# Patient Record
Sex: Male | Born: 1980 | Race: White | Hispanic: No | Marital: Married | Smoking: Former smoker
Health system: Southern US, Community
[De-identification: ages and names within clinical notes are randomized; demographics above are authoritative.]

## PROBLEM LIST (undated history)

## (undated) DIAGNOSIS — F32A Depression, unspecified: Secondary | ICD-10-CM

## (undated) DIAGNOSIS — F329 Major depressive disorder, single episode, unspecified: Secondary | ICD-10-CM

## (undated) DIAGNOSIS — K219 Gastro-esophageal reflux disease without esophagitis: Secondary | ICD-10-CM

## (undated) DIAGNOSIS — F419 Anxiety disorder, unspecified: Secondary | ICD-10-CM

## (undated) HISTORY — PX: APPENDECTOMY: SHX54

---

## 2016-09-29 DIAGNOSIS — K219 Gastro-esophageal reflux disease without esophagitis: Secondary | ICD-10-CM | POA: Insufficient documentation

## 2018-01-30 DIAGNOSIS — F329 Major depressive disorder, single episode, unspecified: Secondary | ICD-10-CM | POA: Insufficient documentation

## 2018-04-06 ENCOUNTER — Encounter: Payer: Self-pay | Admitting: Family Medicine

## 2018-04-06 ENCOUNTER — Ambulatory Visit: Payer: Commercial Managed Care - PPO | Admitting: Family Medicine

## 2018-04-06 VITALS — BP 139/88 | HR 93 | Ht 71.0 in | Wt 225.0 lb

## 2018-04-06 DIAGNOSIS — M25551 Pain in right hip: Secondary | ICD-10-CM | POA: Diagnosis not present

## 2018-04-06 DIAGNOSIS — M25552 Pain in left hip: Secondary | ICD-10-CM | POA: Diagnosis not present

## 2018-04-06 MED ORDER — DICLOFENAC SODIUM 75 MG PO TBEC: 75.0000 mg | DELAYED_RELEASE_TABLET | Freq: Two times a day (BID) | ORAL | 1 refills | Status: DC

## 2018-04-06 MED ORDER — PREDNISONE 10 MG PO TABS: ORAL_TABLET | ORAL | 0 refills | Status: DC

## 2018-04-06 NOTE — Patient Instructions (Addendum)
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 Avoid painful activities as much as possible. Ice the area 3-4 times a day for 15 minutes at a time. Take prednisone dose pack x 6 days with food for pain and inflammation. Day AFTER finishing the prednisone, restart the diclofenac twice a day with food. Physical therapy for hip, pelvic, core stretching/strengthening has shown to be helpful for this condition once you're out of the very painful phase. Avoid activities that increase the pain if possible. Follow up with me in 4 weeks.

## 2018-04-07 ENCOUNTER — Encounter: Payer: Self-pay | Admitting: Family Medicine

## 2018-04-07 NOTE — Progress Notes (Signed)
PCP: Houston Siren., MD Consultation requested by: Dollene Primrose PA-C  Subjective:   HPI: Patient is a 37 y.o. male here for bilateral hip pain.  Patient reports about 4-5 weeks ago he started to get pain in medial aspect of left groin without injury. Radiation down thigh medially as well but not past the knee. Then one day felt same pain on right side with same radiation. Pain 3/10 but up to 8-10/10 and sharp at times. Some benefit with ibuprofen. Tried diclofenac and muscle relaxant as well. Had been walking 2 miles a day at lunchtime but not done since this started. Pain worse with abduction of hip muscles. No prior injury - no other increase in activity level aside from his walking at lunch. No skin changes, numbness. No bowel/bladder dysfunction. No dysuria, GI complaints. Intermittent soreness of low back but not concurrent with his groin pain. Radiographs of bilateral hips were normal, lumbar spine only with mild degenerative facet changes at L5-S1 UA was negative but concentrated, CBC normal, BMP without abnormalities to account for pain, ESR elevated mildly at 25, CK normal at 59.  History reviewed. No pertinent past medical history.  Current Outpatient Medications on File Prior to Visit  Medication Sig Dispense Refill  . Testosterone 2 MG/24HR PT24 Place onto the skin.    Marland Kitchen omeprazole (PRILOSEC) 20 MG capsule Take by mouth daily.  5  . pravastatin (PRAVACHOL) 20 MG tablet Take 20 mg by mouth daily.  11  . venlafaxine XR (EFFEXOR-XR) 37.5 MG 24 hr capsule Take by mouth daily.  11   No current facility-administered medications on file prior to visit.     History reviewed. No pertinent surgical history.  No Known Allergies  Social History   Socioeconomic History  . Marital status: Married    Spouse name: Not on file  . Number of children: Not on file  . Years of education: Not on file  . Highest education level: Not on file  Occupational History  .  Not on file  Social Needs  . Financial resource strain: Not on file  . Food insecurity:    Worry: Not on file    Inability: Not on file  . Transportation needs:    Medical: Not on file    Non-medical: Not on file  Tobacco Use  . Smoking status: Never Smoker  . Smokeless tobacco: Never Used  Substance and Sexual Activity  . Alcohol use: Not on file  . Drug use: Not on file  . Sexual activity: Not on file  Lifestyle  . Physical activity:    Days per week: Not on file    Minutes per session: Not on file  . Stress: Not on file  Relationships  . Social connections:    Talks on phone: Not on file    Gets together: Not on file    Attends religious service: Not on file    Active member of club or organization: Not on file    Attends meetings of clubs or organizations: Not on file    Relationship status: Not on file  . Intimate partner violence:    Fear of current or ex partner: Not on file    Emotionally abused: Not on file    Physically abused: Not on file    Forced sexual activity: Not on file  Other Topics Concern  . Not on file  Social History Narrative  . Not on file    History reviewed. No pertinent family history.  BP 139/88   Pulse 93   Ht _0  (1.803 m)   Wt 225 lb (102.1 kg)   BMI 31.38 kg/m   Review of Systems: See HPI above.     Objective:  Physical Exam:  Gen: NAD, comfortable in exam room  Back/Abdomen: No gross deformity, scoliosis. No TTP low back, abdomen, conjoint tendon.  No midline or bony TTP. FROM with pain on extension felt in bilateral groin. Strength LEs 5/5 hip flexion, extension, knee flexion and extension, ankle dorsiflexion and plantarflexion   2+ MSRs in patellar and achilles tendons, equal bilaterally. Negative SLRs. Sensation intact to light touch bilaterally.  Left hip: No deformity. FROM with 3/5 strength hip adduction, 5/5 abduction and other motions. Mild tenderness adductors, no significant tenderness pubic  symphysis NVI distally. Logroll no pain IR but pain ER felt medial groin. Negative fabers and piriformis stretches.  Right hip: No deformity. FROM with 3/5 strength hip adduction, 5/5 abduction and other motions. Mild tenderness adductors, no significant tenderness pubic symphysis NVI distally. Logroll no pain IR but pain ER felt medial groin. Negative fabers and piriformis stretches.   Assessment & Plan:  1. Bilateral hip pain - reviewed radiograph report of hips and low back and no evidence AVN of hips, other bony abnormalities.  His insidious onset of pain, affected adductor musculature on both sides with weakness and reproduction of pain consistent with developing osteitis pubis.  Icing, trial prednisone dose pack then restart diclofenac twice a day with food.  Physical therapy in the future but would not tolerate this currently.  F/u in 4 weeks.

## 2018-04-10 ENCOUNTER — Encounter: Payer: Self-pay | Admitting: Family Medicine

## 2018-05-04 ENCOUNTER — Ambulatory Visit: Payer: Commercial Managed Care - PPO | Admitting: Family Medicine

## 2018-05-18 ENCOUNTER — Ambulatory Visit (HOSPITAL_BASED_OUTPATIENT_CLINIC_OR_DEPARTMENT_OTHER)
Admission: RE | Admit: 2018-05-18 | Discharge: 2018-05-18 | Disposition: A | Discharge status: Home / Self Care | Payer: Commercial Managed Care - PPO | Source: Ambulatory Visit | Attending: Family Medicine | Admitting: Family Medicine

## 2018-05-18 ENCOUNTER — Ambulatory Visit: Payer: Commercial Managed Care - PPO | Admitting: Family Medicine

## 2018-05-18 ENCOUNTER — Encounter: Payer: Self-pay | Admitting: Family Medicine

## 2018-05-18 VITALS — BP 150/88 | HR 80 | Ht 71.0 in | Wt 230.0 lb

## 2018-05-18 DIAGNOSIS — M25551 Pain in right hip: Secondary | ICD-10-CM | POA: Diagnosis not present

## 2018-05-18 DIAGNOSIS — M79604 Pain in right leg: Secondary | ICD-10-CM

## 2018-05-18 DIAGNOSIS — M25552 Pain in left hip: Secondary | ICD-10-CM | POA: Diagnosis not present

## 2018-05-18 NOTE — Progress Notes (Signed)
PCP: Houston Siren., MD Consultation requested by: Dollene Primrose PA-C  Subjective:   HPI: Patient is a 38 y.o. male here for bilateral hip pain.  11/21: Patient reports about 4-5 weeks ago he started to get pain in medial aspect of left groin without injury. Radiation down thigh medially as well but not past the knee. Then one day felt same pain on right side with same radiation. Pain 3/10 but up to 8-10/10 and sharp at times. Some benefit with ibuprofen. Tried diclofenac and muscle relaxant as well. Had been walking 2 miles a day at lunchtime but not done since this started. Pain worse with abduction of hip muscles. No prior injury - no other increase in activity level aside from his walking at lunch. No skin changes, numbness. No bowel/bladder dysfunction. No dysuria, GI complaints. Intermittent soreness of low back but not concurrent with his groin pain. Radiographs of bilateral hips were normal, lumbar spine only with mild degenerative facet changes at L5-S1 UA was negative but concentrated, CBC normal, BMP without abnormalities to account for pain, ESR elevated mildly at 25, CK normal at 59.  05/18/18: Patient reports he feels about the same compared to last visit. He's taking diclofenac twice a day. Prednisone didn't help. Pain is 0/10 with sitting but up to 8/10 and sharp at worst in groin down into medial thighs. No skin changes, numbness.  History reviewed. No pertinent past medical history.  Current Outpatient Medications on File Prior to Visit  Medication Sig Dispense Refill  . diclofenac (VOLTAREN) 75 MG EC tablet Take 1 tablet (75 mg total) by mouth 2 (two) times daily. Start AFTER finishing the prednisone 60 tablet 1  . omeprazole (PRILOSEC) 20 MG capsule Take by mouth daily.  5  . pravastatin (PRAVACHOL) 20 MG tablet Take 20 mg by mouth daily.  11  . predniSONE (DELTASONE) 10 MG tablet 6 tabs po day 1, 5 tabs po day 2, 4 tabs po day 3, 3 tabs po day 4, 2  tabs po day 5, 1 tab po day 6 21 tablet 0  . Testosterone 2 MG/24HR PT24 Place onto the skin.    Marland Kitchen venlafaxine XR (EFFEXOR-XR) 37.5 MG 24 hr capsule Take by mouth daily.  11   No current facility-administered medications on file prior to visit.     History reviewed. No pertinent surgical history.  No Known Allergies  Social History   Socioeconomic History  . Marital status: Married    Spouse name: Not on file  . Number of children: Not on file  . Years of education: Not on file  . Highest education level: Not on file  Occupational History  . Not on file  Social Needs  . Financial resource strain: Not on file  . Food insecurity:    Worry: Not on file    Inability: Not on file  . Transportation needs:    Medical: Not on file    Non-medical: Not on file  Tobacco Use  . Smoking status: Never Smoker  . Smokeless tobacco: Never Used  Substance and Sexual Activity  . Alcohol use: Not on file  . Drug use: Not on file  . Sexual activity: Not on file  Lifestyle  . Physical activity:    Days per week: Not on file    Minutes per session: Not on file  . Stress: Not on file  Relationships  . Social connections:    Talks on phone: Not on file    Gets together:  Not on file    Attends religious service: Not on file    Active member of club or organization: Not on file    Attends meetings of clubs or organizations: Not on file    Relationship status: Not on file  . Intimate partner violence:    Fear of current or ex partner: Not on file    Emotionally abused: Not on file    Physically abused: Not on file    Forced sexual activity: Not on file  Other Topics Concern  . Not on file  Social History Narrative  . Not on file    History reviewed. No pertinent family history.  BP (!) 150/88   Pulse 80   Ht _0  (1.803 m)   Wt 230 lb (104.3 kg)   BMI 32.08 kg/m   Review of Systems: See HPI above.     Objective:  Physical Exam:  Gen: NAD, comfortable in exam  room  Back: No deformity, scoliosis. No tenderness. Strength 5/5 bilateral lower extremities including hip adduction. NVI distally. Negative SLRs.  Left hip: No deformity. FROM with 5/5 strength. Tenderness within adductor musculature mid-proximally near insertion at pubis. NVI distally. Pain felt with ER medial aspect of groin. Negative fabers and piriformis stretches.  Right hip: No deformity. FROM with 5/5 strength. Tenderness within adductor musculature mid-proximally near insertion at pubis. NVI distally. Pain felt with ER medial aspect of groin. Negative fabers and piriformis stretches.   Assessment & Plan:  1. Bilateral hip pain - radiographs of hips and lumbar spine without findings to account for his pain.  Insidious onset of pain and inclusion of adductors would suggest osteitis pubis but not improving with rest, no significant pubic symphysis tenderness.  Radiographs performed today and independently reviewed - no evidence myositis ossificans but proximal lateral aspect of femoral head appears to be notched - more lateral though than would expect AVN.  Will go ahead with MRI of pelvis to include both hips to further assess.

## 2018-05-18 NOTE — Patient Instructions (Signed)
Get x-rays of your right femur downstairs to assess for myositis ossificans. If these are normal I'd recommend going ahead with an MRI of your pelvis to assess for osteitis pubis.

## 2018-05-25 NOTE — Addendum Note (Signed)
Addended by: Kathi Simpers F on: 05/25/2018 12:24 PM   Modules accepted: Orders

## 2018-05-27 ENCOUNTER — Inpatient Hospital Stay: Admission: RE | Admit: 2018-05-27 | Payer: Commercial Managed Care - PPO | Source: Ambulatory Visit

## 2018-06-19 ENCOUNTER — Ambulatory Visit
Admission: RE | Admit: 2018-06-19 | Discharge: 2018-06-19 | Disposition: A | Discharge status: Home / Self Care | Payer: Commercial Managed Care - PPO | Source: Ambulatory Visit | Attending: Family Medicine | Admitting: Family Medicine

## 2018-06-19 DIAGNOSIS — M25552 Pain in left hip: Principal | ICD-10-CM

## 2018-06-19 DIAGNOSIS — M25551 Pain in right hip: Secondary | ICD-10-CM

## 2018-06-21 NOTE — Addendum Note (Signed)
Addended by: Kathi SimpersWISE, Destiney Sanabia F on: 06/21/2018 02:25 PM   Modules accepted: Orders

## 2018-06-22 ENCOUNTER — Ambulatory Visit (INDEPENDENT_AMBULATORY_CARE_PROVIDER_SITE_OTHER): Payer: Commercial Managed Care - PPO | Admitting: Orthopaedic Surgery

## 2018-06-22 ENCOUNTER — Encounter (INDEPENDENT_AMBULATORY_CARE_PROVIDER_SITE_OTHER): Payer: Self-pay | Admitting: Orthopaedic Surgery

## 2018-06-22 ENCOUNTER — Ambulatory Visit (INDEPENDENT_AMBULATORY_CARE_PROVIDER_SITE_OTHER): Payer: Commercial Managed Care - PPO

## 2018-06-22 ENCOUNTER — Inpatient Hospital Stay: Admission: RE | Admit: 2018-06-22 | Payer: Commercial Managed Care - PPO | Source: Ambulatory Visit

## 2018-06-22 DIAGNOSIS — M25551 Pain in right hip: Secondary | ICD-10-CM

## 2018-06-22 DIAGNOSIS — M87051 Idiopathic aseptic necrosis of right femur: Secondary | ICD-10-CM | POA: Insufficient documentation

## 2018-06-22 DIAGNOSIS — M25552 Pain in left hip: Secondary | ICD-10-CM

## 2018-06-22 DIAGNOSIS — M87052 Idiopathic aseptic necrosis of left femur: Secondary | ICD-10-CM | POA: Diagnosis not present

## 2018-06-22 NOTE — Progress Notes (Signed)
Office Visit Note   Patient: Troy Bowen           Date of Birth: Apr 26, 1981           MRN: [id number] Visit Date: 06/22/2018              Requested by: Lester [place]., MD [address] [place], [place] [address] PCP: Lester [place]., MD   Assessment & Plan: Visit Diagnoses:  1. Pain in left hip   2. Pain in right hip   3. Avascular necrosis of bone of hip, left (HCC)   4. Avascular necrosis of bone of hip, right (HCC)     Plan: I went over his plain films and MRI in detail.  The MRI shows an effusion of both hips with cystic changes and cartilage irregularities of both femoral heads consistent with severe avascular necrosis.  At this point treatment options would be do nothing versus bilateral total hip arthroplasties.  I showed him a hip model and went over his studies with him.  We had a long and thorough discussion about hip replacement surgery.  I talked about the risk and benefits of the surgery as well as his intraoperative and postoperative course and what all this involves.  After long and thorough discussion all question concerns were answered and addressed.  He does wish to have both his hips replaced.  I agree with doing both of these at once.  He also understands that if we are only able to get one done that we will have to reschedule for the other one but right now plan will be to do both hips at once giving the impending femoral head collapse of both hips.  Follow-Up Instructions: Return for 2 weeks post-op.   Orders:  Orders Placed This Encounter  Procedures  . XR Pelvis 1-2 Views   No orders of the defined types were placed in this encounter.     Procedures: No procedures performed   Clinical Data: No additional findings.   Subjective: Chief Complaint  Patient presents with  . Left Hip - Pain  . Right Hip - Pain  Patient is a very pleasant and active 38 year old gentleman who comes in for evaluation treatment of bilateral hip avascular  necrosis.  It is been getting worse for about 5 months now.  It started mainly pain in his thighs that radiated to his left groin and now to the right side.  He has MRIs that accompany him as well that are on the canopy system to confirm significant avascular necrosis of both femoral heads.  His pain is daily and it is in his groin.  He can be 10/10 at times.  It hurts with pivoting activities and does wake him up at night.  He mainly hurts with weightbearing.  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  He is currently not a drinker or smoker.  He is not a diabetic.  He totally understands that he does have this diagnosis of bilateral hip avascular necrosis.  He also understands that we can talk about hip replacement surgery today as a treatment option.  HPI  Review of Systems He currently denies any headache, chest pain, shortness of breath, fever, chills, nausea, vomiting.  Objective: Vital Signs: There were no vitals taken for this visit.  Physical Exam He is alert and orient x3 and in no acute distress Ortho Exam Examination of both hips show severe pain with attempts of internal or external  rotation.  There is stiffness of both hips as well.  He walks with a slight limp. Specialty Comments:  No specialty comments available.  Imaging: Xr Pelvis 1-2 Views  Result Date: 06/22/2018 An AP pelvis and lateral of each hip shows significant irregularities in the femoral head on both sides consistent with avascular necrosis.  This is also seen on a recent MRI showing both hips.  There is cystic changes in both femoral heads and impending femoral head collapse.    PMFS History: Patient Active Problem List   Diagnosis Date Noted  . Avascular necrosis of bone of hip, left (HCC) 06/22/2018  . Avascular necrosis of bone of hip, right (HCC) 06/22/2018  . Current episode of major depressive disorder without prior episode 01/30/2018  . Gastroesophageal reflux disease without  esophagitis 09/29/2016   History reviewed. No pertinent past medical history.  History reviewed. No pertinent family history.  History reviewed. No pertinent surgical history. Social History   Occupational History  . Not on file  Tobacco Use  . Smoking status: Never Smoker  . Smokeless tobacco: Never Used  Substance and Sexual Activity  . Alcohol use: Not on file  . Drug use: Not on file  . Sexual activity: Not on file

## 2018-07-07 ENCOUNTER — Other Ambulatory Visit (INDEPENDENT_AMBULATORY_CARE_PROVIDER_SITE_OTHER): Payer: Self-pay | Admitting: Physician Assistant

## 2018-07-14 NOTE — Patient Instructions (Signed)
Iroh Jeschke  07/14/2018   Your procedure is scheduled on: 07-21-18    Report to [facility] Main  Entrance    Report to Admitting at 9:45 AM    Call this number if you have problems the morning of surgery [phone]    Remember: Do not eat food or drink liquids :After Midnight.    BRUSH YOUR TEETH MORNING OF SURGERY AND RINSE YOUR MOUTH OUT, NO CHEWING GUM CANDY OR MINTS.     Take these medicines the morning of surgery with A SIP OF WATER: Escitalopram (Lexapro), Omeprazole (Prilosec), and Venlafaxine XR (Effexor-XR)                                You may not have any metal on your body including hair pins and              piercings  Do not wear jewelry, cologne, lotions, powders or deodorant             Men may shave face and neck.   Do not bring valuables to the hospital. [facility] IS NOT             RESPONSIBLE   FOR VALUABLES.  Contacts, dentures or bridgework may not be worn into surgery.  Leave suitcase in the car. After surgery it may be brought to your room.     Patients discharged the day of surgery will not be allowed to drive home. IF YOU ARE HAVING SURGERY AND GOING HOME THE SAME DAY, YOU MUST HAVE AN ADULT TO DRIVE YOU HOME AND BE WITH YOU FOR 24 HOURS. YOU MAY GO HOME BY TAXI OR UBER OR ORTHERWISE, BUT AN ADULT MUST ACCOMPANY YOU HOME AND STAY WITH YOU FOR 24 HOURS.    Special Instructions: N/A              Please read over the following fact sheets you were given: _____________________________________________________________________             [facility] - Preparing for Surgery Before surgery, you can play an important role.  Because skin is not sterile, your skin needs to be as free of germs as possible.  You can reduce the number of germs on your skin by washing with CHG (chlorahexidine gluconate) soap before surgery.  CHG is an antiseptic cleaner which kills germs and bonds with the skin to continue killing germs even  after washing. Please DO NOT use if you have an allergy to CHG or antibacterial soaps.  If your skin becomes reddened/irritated stop using the CHG and inform your nurse when you arrive at Short Stay. Do not shave (including legs and underarms) for at least 48 hours prior to the first CHG shower.  You may shave your face/neck. Please follow these instructions carefully:  1.  Shower with CHG Soap the night before surgery and the  morning of Surgery.  2.  If you choose to wash your hair, wash your hair first as usual with your  normal  shampoo.  3.  After you shampoo, rinse your hair and body thoroughly to remove the  shampoo.                           4.  Use CHG as you would any other liquid soap.  You can apply chg directly  to the skin and wash                       Gently with a scrungie or clean washcloth.  5.  Apply the CHG Soap to your body ONLY FROM THE NECK DOWN.   Do not use on face/ open                           Wound or open sores. Avoid contact with eyes, ears mouth and genitals (private parts).                       Wash face,  Genitals (private parts) with your normal soap.             6.  Wash thoroughly, paying special attention to the area where your surgery  will be performed.  7.  Thoroughly rinse your body with warm water from the neck down.  8.  DO NOT shower/wash with your normal soap after using and rinsing off  the CHG Soap.                9.  Pat yourself dry with a clean towel.            10.  Wear clean pajamas.            11.  Place clean sheets on your bed the night of your first shower and do not  sleep with pets. Day of Surgery : Do not apply any lotions/deodorants the morning of surgery.  Please wear clean clothes to the hospital/surgery center.  FAILURE TO FOLLOW THESE INSTRUCTIONS MAY RESULT IN THE CANCELLATION OF YOUR SURGERY PATIENT SIGNATURE_________________________________  NURSE  SIGNATURE__________________________________  ________________________________________________________________________   Adam Phenix  An incentive spirometer is a tool that can help keep your lungs clear and active. This tool measures how well you are filling your lungs with each breath. Taking long deep breaths may help reverse or decrease the chance of developing breathing (pulmonary) problems (especially infection) following:  A long period of time when you are unable to move or be active. BEFORE THE PROCEDURE   If the spirometer includes an indicator to show your best effort, your nurse or respiratory therapist will set it to a desired goal.  If possible, sit up straight or lean slightly forward. Try not to slouch.  Hold the incentive spirometer in an upright position. INSTRUCTIONS FOR USE  1. Sit on the edge of your bed if possible, or sit up as far as you can in bed or on a chair. 2. Hold the incentive spirometer in an upright position. 3. Breathe out normally. 4. Place the mouthpiece in your mouth and seal your lips tightly around it. 5. Breathe in slowly and as deeply as possible, raising the piston or the ball toward the top of the column. 6. Hold your breath for 3-5 seconds or for as long as possible. Allow the piston or ball to fall to the bottom of the column. 7. Remove the mouthpiece from your mouth and breathe out normally. 8. Rest for a few seconds and repeat Steps 1 through 7 at least 10 times every 1-2 hours when you are awake. Take your time and take a few normal breaths between deep breaths. 9. The spirometer may include an indicator to show your best effort. Use the indicator as a goal to work toward  during each repetition. 10. After each set of 10 deep breaths, practice coughing to be sure your lungs are clear. If you have an incision (the cut made at the time of surgery), support your incision when coughing by placing a pillow or rolled up towels firmly  against it. Once you are able to get out of bed, walk around indoors and cough well. You may stop using the incentive spirometer when instructed by your caregiver.  RISKS AND COMPLICATIONS  Take your time so you do not get dizzy or light-headed.  If you are in pain, you may need to take or ask for pain medication before doing incentive spirometry. It is harder to take a deep breath if you are having pain. AFTER USE  Rest and breathe slowly and easily.  It can be helpful to keep track of a log of your progress. Your caregiver can provide you with a simple table to help with this. If you are using the spirometer at home, follow these instructions: [place] IF:   You are having difficultly using the spirometer.  You have trouble using the spirometer as often as instructed.  Your pain medication is not giving enough relief while using the spirometer.  You develop fever of 100.5 F (38.1 C) or higher. SEEK IMMEDIATE MEDICAL CARE IF:   You cough up bloody sputum that had not been present before.  You develop fever of 102 F (38.9 C) or greater.  You develop worsening pain at or near the incision site. MAKE SURE YOU:   Understand these instructions.  Will watch your condition.  Will get help right away if you are not doing well or get worse. Document Released: 09/13/2006 Document Revised: 07/26/2011 Document Reviewed: 11/14/2006 [facility] Patient Information 2014 [place], [place].   ________________________________________________________________________

## 2018-07-17 ENCOUNTER — Encounter (HOSPITAL_COMMUNITY)
Admission: RE | Admit: 2018-07-17 | Discharge: 2018-07-17 | Disposition: A | Discharge status: Home / Self Care | Payer: Commercial Managed Care - PPO | Source: Ambulatory Visit | Attending: Orthopaedic Surgery | Admitting: Orthopaedic Surgery

## 2018-07-17 ENCOUNTER — Encounter (HOSPITAL_COMMUNITY): Payer: Self-pay

## 2018-07-17 ENCOUNTER — Other Ambulatory Visit: Payer: Self-pay

## 2018-07-17 DIAGNOSIS — Z01812 Encounter for preprocedural laboratory examination: Secondary | ICD-10-CM | POA: Diagnosis not present

## 2018-07-17 HISTORY — DX: Depression, unspecified: F32.A

## 2018-07-17 HISTORY — DX: Anxiety disorder, unspecified: F41.9

## 2018-07-17 HISTORY — DX: Gastro-esophageal reflux disease without esophagitis: K21.9

## 2018-07-17 HISTORY — DX: Major depressive disorder, single episode, unspecified: F32.9

## 2018-07-17 LAB — BASIC METABOLIC PANEL
Anion gap: 9 (ref 5–15)
BUN: 13 mg/dL (ref 6–20)
CO2: 26 mmol/L (ref 22–32)
Calcium: 9.7 mg/dL (ref 8.9–10.3)
Chloride: 102 mmol/L (ref 98–111)
Creatinine, Ser: 1.12 mg/dL (ref 0.61–1.24)
GFR calc non Af Amer: >60 mL/min (ref >60)
Glucose, Bld: 108 mg/dL — ABNORMAL HIGH (ref 70–99)
Potassium: 4.5 mmol/L (ref 3.5–5.1)
SODIUM: 137 mmol/L (ref 135–145)

## 2018-07-17 LAB — CBC
HCT: 44.6 % (ref 39.0–52.0)
Hemoglobin: 14.2 g/dL (ref 13.0–17.0)
MCH: 29.1 pg (ref 26.0–34.0)
MCHC: 31.8 g/dL (ref 30.0–36.0)
MCV: 91.4 fL (ref 80.0–100.0)
Platelets: 225 10*3/uL (ref 150–400)
RBC: 4.88 MIL/uL (ref 4.22–5.81)
RDW: 13.1 % (ref 11.5–15.5)
WBC: 5 10*3/uL (ref 4.0–10.5)
nRBC: 0 % (ref 0.0–0.2)

## 2018-07-17 LAB — SURGICAL PCR SCREEN
MRSA, PCR: NEGATIVE
Staphylococcus aureus: NEGATIVE

## 2018-07-21 ENCOUNTER — Inpatient Hospital Stay (HOSPITAL_COMMUNITY): Payer: Commercial Managed Care - PPO

## 2018-07-21 ENCOUNTER — Other Ambulatory Visit: Payer: Self-pay

## 2018-07-21 ENCOUNTER — Encounter (HOSPITAL_COMMUNITY)
Admission: RE | Disposition: A | Discharge status: Home Health | Payer: Self-pay | Source: Home / Self Care | Attending: Orthopaedic Surgery

## 2018-07-21 ENCOUNTER — Encounter (HOSPITAL_COMMUNITY): Payer: Self-pay | Admitting: *Deleted

## 2018-07-21 ENCOUNTER — Inpatient Hospital Stay (HOSPITAL_COMMUNITY): Payer: Commercial Managed Care - PPO | Admitting: Physician Assistant

## 2018-07-21 ENCOUNTER — Inpatient Hospital Stay (HOSPITAL_COMMUNITY): Payer: Commercial Managed Care - PPO | Admitting: Anesthesiology

## 2018-07-21 ENCOUNTER — Inpatient Hospital Stay (HOSPITAL_COMMUNITY)
Admission: RE | Admit: 2018-07-21 | Discharge: 2018-07-26 | DRG: 462 | Disposition: A | Discharge status: Home Health | Payer: Commercial Managed Care - PPO | Attending: Orthopaedic Surgery | Admitting: Orthopaedic Surgery

## 2018-07-21 DIAGNOSIS — F329 Major depressive disorder, single episode, unspecified: Secondary | ICD-10-CM | POA: Diagnosis present

## 2018-07-21 DIAGNOSIS — R000 Tachycardia, unspecified: Secondary | ICD-10-CM | POA: Diagnosis not present

## 2018-07-21 DIAGNOSIS — F419 Anxiety disorder, unspecified: Secondary | ICD-10-CM | POA: Diagnosis present

## 2018-07-21 DIAGNOSIS — I10 Essential (primary) hypertension: Secondary | ICD-10-CM | POA: Diagnosis present

## 2018-07-21 DIAGNOSIS — Z79899 Other long term (current) drug therapy: Secondary | ICD-10-CM | POA: Diagnosis not present

## 2018-07-21 DIAGNOSIS — Y92239 Unspecified place in hospital as the place of occurrence of the external cause: Secondary | ICD-10-CM | POA: Diagnosis not present

## 2018-07-21 DIAGNOSIS — M87052 Idiopathic aseptic necrosis of left femur: Secondary | ICD-10-CM | POA: Diagnosis not present

## 2018-07-21 DIAGNOSIS — M87851 Other osteonecrosis, right femur: Principal | ICD-10-CM | POA: Diagnosis present

## 2018-07-21 DIAGNOSIS — K219 Gastro-esophageal reflux disease without esophagitis: Secondary | ICD-10-CM | POA: Diagnosis present

## 2018-07-21 DIAGNOSIS — M87852 Other osteonecrosis, left femur: Secondary | ICD-10-CM | POA: Diagnosis present

## 2018-07-21 DIAGNOSIS — K30 Functional dyspepsia: Secondary | ICD-10-CM | POA: Diagnosis not present

## 2018-07-21 DIAGNOSIS — K567 Ileus, unspecified: Secondary | ICD-10-CM

## 2018-07-21 DIAGNOSIS — Z87891 Personal history of nicotine dependence: Secondary | ICD-10-CM

## 2018-07-21 DIAGNOSIS — M87051 Idiopathic aseptic necrosis of right femur: Secondary | ICD-10-CM

## 2018-07-21 DIAGNOSIS — M1990 Unspecified osteoarthritis, unspecified site: Secondary | ICD-10-CM | POA: Diagnosis present

## 2018-07-21 DIAGNOSIS — Z96643 Presence of artificial hip joint, bilateral: Secondary | ICD-10-CM

## 2018-07-21 DIAGNOSIS — M25559 Pain in unspecified hip: Secondary | ICD-10-CM

## 2018-07-21 DIAGNOSIS — K9189 Other postprocedural complications and disorders of digestive system: Secondary | ICD-10-CM

## 2018-07-21 DIAGNOSIS — T40605A Adverse effect of unspecified narcotics, initial encounter: Secondary | ICD-10-CM | POA: Diagnosis not present

## 2018-07-21 DIAGNOSIS — G4733 Obstructive sleep apnea (adult) (pediatric): Secondary | ICD-10-CM | POA: Diagnosis present

## 2018-07-21 HISTORY — PX: BILATERAL ANTERIOR TOTAL HIP ARTHROPLASTY: SHX5567

## 2018-07-21 SURGERY — ARTHROPLASTY, HIP, BILATERAL, TOTAL, ANTERIOR APPROACH
Anesthesia: Spinal | Site: Hip | Laterality: Bilateral

## 2018-07-21 MED ORDER — GABAPENTIN 100 MG PO CAPS
100.0000 mg | ORAL_CAPSULE | Freq: Three times a day (TID) | ORAL | Status: DC
  Administered 2018-07-21 – 2018-07-26 (×14): 100 mg via ORAL
  Filled 2018-07-21 (×14): qty 1

## 2018-07-21 MED ORDER — PROPOFOL 10 MG/ML IV BOLUS
INTRAVENOUS | Status: AC
  Filled 2018-07-21: qty 40

## 2018-07-21 MED ORDER — LACTATED RINGERS IV SOLN
INTRAVENOUS | Status: DC
  Administered 2018-07-21 (×3): via INTRAVENOUS

## 2018-07-21 MED ORDER — 0.9 % SODIUM CHLORIDE (POUR BTL) OPTIME
TOPICAL | Status: DC | PRN
  Administered 2018-07-21: 1000 mL

## 2018-07-21 MED ORDER — ACETAMINOPHEN 500 MG PO TABS
1000.0000 mg | ORAL_TABLET | Freq: Once | ORAL | Status: AC
  Administered 2018-07-21: 1000 mg via ORAL
  Filled 2018-07-21: qty 2

## 2018-07-21 MED ORDER — ONDANSETRON HCL 4 MG PO TABS: 4.0000 mg | ORAL_TABLET | Freq: Four times a day (QID) | ORAL | Status: DC | PRN

## 2018-07-21 MED ORDER — MENTHOL 3 MG MT LOZG: 1.0000 | LOZENGE | OROMUCOSAL | Status: DC | PRN

## 2018-07-21 MED ORDER — METOCLOPRAMIDE HCL 5 MG/ML IJ SOLN: 5.0000 mg | Freq: Three times a day (TID) | INTRAMUSCULAR | Status: DC | PRN

## 2018-07-21 MED ORDER — MIDAZOLAM HCL 2 MG/2ML IJ SOLN
INTRAMUSCULAR | Status: AC
  Filled 2018-07-21: qty 2

## 2018-07-21 MED ORDER — KETAMINE HCL 10 MG/ML IJ SOLN
INTRAMUSCULAR | Status: DC | PRN
  Administered 2018-07-21 (×4): 10 mg via INTRAVENOUS

## 2018-07-21 MED ORDER — MIDAZOLAM HCL 5 MG/5ML IJ SOLN
INTRAMUSCULAR | Status: DC | PRN
  Administered 2018-07-21: 1 mg via INTRAVENOUS
  Administered 2018-07-21: 2 mg via INTRAVENOUS

## 2018-07-21 MED ORDER — ONDANSETRON HCL 4 MG/2ML IJ SOLN: 4.0000 mg | Freq: Four times a day (QID) | INTRAMUSCULAR | Status: DC | PRN

## 2018-07-21 MED ORDER — FENTANYL CITRATE (PF) 100 MCG/2ML IJ SOLN
INTRAMUSCULAR | Status: AC
  Filled 2018-07-21: qty 2

## 2018-07-21 MED ORDER — CEFAZOLIN SODIUM-DEXTROSE 2-4 GM/100ML-% IV SOLN
2.0000 g | INTRAVENOUS | Status: AC
  Administered 2018-07-21: 2 g via INTRAVENOUS
  Filled 2018-07-21: qty 100

## 2018-07-21 MED ORDER — VENLAFAXINE HCL ER 75 MG PO CP24
75.0000 mg | ORAL_CAPSULE | Freq: Every day | ORAL | Status: DC
  Administered 2018-07-22 – 2018-07-26 (×5): 75 mg via ORAL
  Filled 2018-07-21 (×5): qty 1

## 2018-07-21 MED ORDER — PROPOFOL 500 MG/50ML IV EMUL
INTRAVENOUS | Status: DC | PRN
  Administered 2018-07-21: 75 ug/kg/min via INTRAVENOUS

## 2018-07-21 MED ORDER — TRANEXAMIC ACID-NACL 1000-0.7 MG/100ML-% IV SOLN
1000.0000 mg | INTRAVENOUS | Status: AC
  Administered 2018-07-21: 1000 mg via INTRAVENOUS
  Filled 2018-07-21: qty 100

## 2018-07-21 MED ORDER — SODIUM CHLORIDE 0.9 % IR SOLN
Status: DC | PRN
  Administered 2018-07-21: 1000 mL

## 2018-07-21 MED ORDER — FENTANYL CITRATE (PF) 100 MCG/2ML IJ SOLN
INTRAMUSCULAR | Status: DC | PRN
  Administered 2018-07-21: 100 ug via INTRAVENOUS
  Administered 2018-07-21 (×2): 50 ug via INTRAVENOUS

## 2018-07-21 MED ORDER — KETAMINE HCL 10 MG/ML IJ SOLN
INTRAMUSCULAR | Status: AC
  Filled 2018-07-21: qty 1

## 2018-07-21 MED ORDER — BUPIVACAINE HCL (PF) 0.5 % IJ SOLN
INTRAMUSCULAR | Status: DC | PRN
  Administered 2018-07-21: 3 mL

## 2018-07-21 MED ORDER — ASPIRIN 81 MG PO CHEW
81.0000 mg | CHEWABLE_TABLET | Freq: Two times a day (BID) | ORAL | Status: DC
  Administered 2018-07-21 – 2018-07-26 (×10): 81 mg via ORAL
  Filled 2018-07-21 (×10): qty 1

## 2018-07-21 MED ORDER — DOCUSATE SODIUM 100 MG PO CAPS
100.0000 mg | ORAL_CAPSULE | Freq: Two times a day (BID) | ORAL | Status: DC
  Administered 2018-07-21 – 2018-07-26 (×10): 100 mg via ORAL
  Filled 2018-07-21 (×10): qty 1

## 2018-07-21 MED ORDER — SODIUM CHLORIDE 0.9 % IV SOLN
INTRAVENOUS | Status: DC | PRN
  Administered 2018-07-21: 30 ug/min via INTRAVENOUS

## 2018-07-21 MED ORDER — PHENOL 1.4 % MT LIQD
1.0000 | OROMUCOSAL | Status: DC | PRN
  Filled 2018-07-21: qty 177

## 2018-07-21 MED ORDER — FENTANYL CITRATE (PF) 100 MCG/2ML IJ SOLN
25.0000 ug | INTRAMUSCULAR | Status: DC | PRN
  Administered 2018-07-21: 50 ug via INTRAVENOUS

## 2018-07-21 MED ORDER — OXYCODONE HCL 5 MG PO TABS
10.0000 mg | ORAL_TABLET | ORAL | Status: DC | PRN
  Administered 2018-07-22 (×2): 15 mg via ORAL
  Administered 2018-07-22: 10 mg via ORAL
  Administered 2018-07-22: 15 mg via ORAL
  Filled 2018-07-21 (×3): qty 3
  Filled 2018-07-21: qty 2

## 2018-07-21 MED ORDER — SODIUM CHLORIDE 0.9 % IV SOLN
INTRAVENOUS | Status: DC
  Administered 2018-07-21 – 2018-07-22 (×3): via INTRAVENOUS

## 2018-07-21 MED ORDER — STERILE WATER FOR IRRIGATION IR SOLN
Status: DC | PRN
  Administered 2018-07-21: 2000 mL

## 2018-07-21 MED ORDER — PHENYLEPHRINE 40 MCG/ML (10ML) SYRINGE FOR IV PUSH (FOR BLOOD PRESSURE SUPPORT)
PREFILLED_SYRINGE | INTRAVENOUS | Status: AC
  Filled 2018-07-21: qty 30

## 2018-07-21 MED ORDER — POLYETHYLENE GLYCOL 3350 17 G PO PACK
17.0000 g | PACK | Freq: Every day | ORAL | Status: DC | PRN
  Administered 2018-07-25: 17 g via ORAL
  Filled 2018-07-21: qty 1

## 2018-07-21 MED ORDER — PHENYLEPHRINE HCL 10 MG/ML IJ SOLN
INTRAMUSCULAR | Status: DC | PRN
  Administered 2018-07-21 (×2): 120 ug via INTRAVENOUS
  Administered 2018-07-21 (×2): 80 ug via INTRAVENOUS

## 2018-07-21 MED ORDER — PROPOFOL 10 MG/ML IV BOLUS
INTRAVENOUS | Status: AC
  Filled 2018-07-21: qty 80

## 2018-07-21 MED ORDER — CHLORHEXIDINE GLUCONATE 4 % EX LIQD: 60.0000 mL | Freq: Once | CUTANEOUS | Status: DC

## 2018-07-21 MED ORDER — OXYCODONE HCL 5 MG PO TABS
5.0000 mg | ORAL_TABLET | ORAL | Status: DC | PRN
  Administered 2018-07-21 (×2): 5 mg via ORAL
  Administered 2018-07-22: 10 mg via ORAL
  Filled 2018-07-21: qty 1
  Filled 2018-07-21: qty 2
  Filled 2018-07-21: qty 1

## 2018-07-21 MED ORDER — FENTANYL CITRATE (PF) 100 MCG/2ML IJ SOLN
INTRAMUSCULAR | Status: AC
  Administered 2018-07-21: 50 ug via INTRAVENOUS
  Filled 2018-07-21: qty 2

## 2018-07-21 MED ORDER — METHOCARBAMOL 500 MG IVPB - SIMPLE MED
500.0000 mg | Freq: Four times a day (QID) | INTRAVENOUS | Status: DC | PRN
  Administered 2018-07-21: 500 mg via INTRAVENOUS
  Filled 2018-07-21: qty 50

## 2018-07-21 MED ORDER — CEFAZOLIN SODIUM-DEXTROSE 1-4 GM/50ML-% IV SOLN
1.0000 g | Freq: Four times a day (QID) | INTRAVENOUS | Status: AC
  Administered 2018-07-21 – 2018-07-22 (×2): 1 g via INTRAVENOUS
  Filled 2018-07-21 (×2): qty 50

## 2018-07-21 MED ORDER — PROMETHAZINE HCL 25 MG/ML IJ SOLN: 6.2500 mg | INTRAMUSCULAR | Status: DC | PRN

## 2018-07-21 MED ORDER — DIPHENHYDRAMINE HCL 12.5 MG/5ML PO ELIX: 12.5000 mg | ORAL_SOLUTION | ORAL | Status: DC | PRN

## 2018-07-21 MED ORDER — ALUM & MAG HYDROXIDE-SIMETH 200-200-20 MG/5ML PO SUSP
30.0000 mL | ORAL | Status: DC | PRN
  Administered 2018-07-23 – 2018-07-25 (×3): 30 mL via ORAL
  Filled 2018-07-21 (×3): qty 30

## 2018-07-21 MED ORDER — METOCLOPRAMIDE HCL 5 MG PO TABS: 5.0000 mg | ORAL_TABLET | Freq: Three times a day (TID) | ORAL | Status: DC | PRN

## 2018-07-21 MED ORDER — PANTOPRAZOLE SODIUM 40 MG PO TBEC
40.0000 mg | DELAYED_RELEASE_TABLET | Freq: Every day | ORAL | Status: DC
  Administered 2018-07-21 – 2018-07-26 (×6): 40 mg via ORAL
  Filled 2018-07-21 (×6): qty 1

## 2018-07-21 MED ORDER — METHOCARBAMOL 500 MG PO TABS
500.0000 mg | ORAL_TABLET | Freq: Four times a day (QID) | ORAL | Status: DC | PRN
  Administered 2018-07-22 (×3): 500 mg via ORAL
  Filled 2018-07-21 (×3): qty 1

## 2018-07-21 MED ORDER — PROPOFOL 10 MG/ML IV BOLUS
INTRAVENOUS | Status: AC
  Filled 2018-07-21: qty 60

## 2018-07-21 MED ORDER — ESCITALOPRAM OXALATE 20 MG PO TABS
20.0000 mg | ORAL_TABLET | Freq: Every day | ORAL | Status: DC
  Administered 2018-07-22 – 2018-07-26 (×5): 20 mg via ORAL
  Filled 2018-07-21 (×5): qty 1

## 2018-07-21 MED ORDER — HYDROMORPHONE HCL 1 MG/ML IJ SOLN
0.5000 mg | INTRAMUSCULAR | Status: DC | PRN
  Administered 2018-07-21 – 2018-07-22 (×2): 0.5 mg via INTRAVENOUS
  Administered 2018-07-22: 1 mg via INTRAVENOUS
  Filled 2018-07-21 (×3): qty 1

## 2018-07-21 MED ORDER — ACETAMINOPHEN 325 MG PO TABS
325.0000 mg | ORAL_TABLET | Freq: Four times a day (QID) | ORAL | Status: DC | PRN
  Administered 2018-07-22: 650 mg via ORAL
  Filled 2018-07-21: qty 2

## 2018-07-21 MED ORDER — PROPOFOL 500 MG/50ML IV EMUL
INTRAVENOUS | Status: DC | PRN
  Administered 2018-07-21 (×2): 30 mg via INTRAVENOUS

## 2018-07-21 SURGICAL SUPPLY — 51 items
ACETAB CUP W GRIPTION 54MM (Plate) ×2 IMPLANT
ACETAB CUP W/GRIPTION 54 (Plate) ×4 IMPLANT
ARTICULEZE HEAD (Hips) IMPLANT
BAG ZIPLOCK 12X15 (MISCELLANEOUS) IMPLANT
BALL HIP ARTICU EZE 36 8.5 (Hips) ×1 IMPLANT
BLADE SAW SGTL 18X1.27X75 (BLADE) ×4 IMPLANT
BLADE SAW SGTL 18X1.27X75MM (BLADE) ×2
BLADE SURG SZ10 CARB STEEL (BLADE) ×6 IMPLANT
COVER PERINEAL POST (MISCELLANEOUS) ×3 IMPLANT
COVER SURGICAL LIGHT HANDLE (MISCELLANEOUS) ×3 IMPLANT
COVER WAND RF STERILE (DRAPES) IMPLANT
CUP ACETAB W/GRIPTION 54 (Plate) ×2 IMPLANT
DRAPE C-ARM 42X120 X-RAY (DRAPES) ×3 IMPLANT
DRAPE STERI IOBAN 125X83 (DRAPES) ×6 IMPLANT
DRAPE U-SHAPE 47X51 STRL (DRAPES) ×9 IMPLANT
DRESSING AQUACEL AG SP 3.5X10 (GAUZE/BANDAGES/DRESSINGS) ×1 IMPLANT
DRSG AQUACEL AG ADV 3.5X10 (GAUZE/BANDAGES/DRESSINGS) ×3 IMPLANT
DRSG AQUACEL AG SP 3.5X10 (GAUZE/BANDAGES/DRESSINGS) ×3
DURAPREP 26ML APPLICATOR (WOUND CARE) ×3 IMPLANT
ELECT BLADE TIP CTD 4 INCH (ELECTRODE) ×3 IMPLANT
ELECT PENCIL ROCKER SW 15FT (MISCELLANEOUS) IMPLANT
ELECT REM PT RETURN 15FT ADLT (MISCELLANEOUS) ×3 IMPLANT
FACESHIELD WRAPAROUND (MASK) ×12 IMPLANT
GAUZE XEROFORM 1X8 LF (GAUZE/BANDAGES/DRESSINGS) ×6 IMPLANT
GLOVE BIO SURGEON STRL SZ7.5 (GLOVE) ×3 IMPLANT
GLOVE BIOGEL PI IND STRL 8 (GLOVE) ×2 IMPLANT
GLOVE BIOGEL PI INDICATOR 8 (GLOVE) ×4
GLOVE ECLIPSE 8.0 STRL XLNG CF (GLOVE) ×3 IMPLANT
GOWN STRL REUS W/TWL XL LVL3 (GOWN DISPOSABLE) ×12 IMPLANT
HANDPIECE INTERPULSE COAX TIP (DISPOSABLE) ×4
HEAD ARTICULEZE (Hips) IMPLANT
HEAD M SROM 36MM PLUS 1.5 (Hips) ×1 IMPLANT
HIP BALL ARTICU EZE 36 8.5 (Hips) ×3 IMPLANT
LINER NEUTRAL 36ID 54OD (Liner) IMPLANT
LINER NEUTRAL 54X36MM PLUS 4 (Hips) ×6 IMPLANT
MARKER SKIN DUAL TIP RULER LAB (MISCELLANEOUS) ×3 IMPLANT
PACK ANTERIOR HIP CUSTOM (KITS) ×3 IMPLANT
PACK UNIVERSAL I (CUSTOM PROCEDURE TRAY) IMPLANT
SET HNDPC FAN SPRY TIP SCT (DISPOSABLE) ×2 IMPLANT
SPONGE LAP 18X18 RF (DISPOSABLE) IMPLANT
SROM M HEAD 36MM PLUS 1.5 (Hips) ×3 IMPLANT
STAPLER VISISTAT 35W (STAPLE) ×6 IMPLANT
STEM FEM ACTIS HIGH SZ7 (Stem) ×6 IMPLANT
SUT ETHIBOND NAB CT1 #1 30IN (SUTURE) ×6 IMPLANT
SUT MNCRL AB 4-0 PS2 18 (SUTURE) IMPLANT
SUT VIC AB 0 CT1 36 (SUTURE) ×6 IMPLANT
SUT VIC AB 1 CT1 36 (SUTURE) ×6 IMPLANT
SUT VIC AB 2-0 CT1 27 (SUTURE) ×4
SUT VIC AB 2-0 CT1 TAPERPNT 27 (SUTURE) ×2 IMPLANT
TRAY FOLEY MTR SLVR 16FR STAT (SET/KITS/TRAYS/PACK) ×3 IMPLANT
YANKAUER SUCT BULB TIP 10FT TU (MISCELLANEOUS) ×6 IMPLANT

## 2018-07-21 NOTE — Anesthesia Procedure Notes (Signed)
Spinal  Start time: 07/21/2018 1:20 PM End time: 07/21/2018 1:25 PM Staffing Resident/CRNA: Kizzie Fantasia, CRNA Performed: resident/CRNA  Preanesthetic Checklist Completed: patient identified, site marked, surgical consent, pre-op evaluation, timeout performed, IV checked, risks and benefits discussed and monitors and equipment checked Spinal Block Patient position: sitting Prep: DuraPrep Patient monitoring: heart rate, continuous pulse ox and blood pressure Approach: midline Location: L3-4 Injection technique: single-shot Needle Needle type: Pencan  Needle gauge: 24 G Needle length: 9 cm Needle insertion depth: 7 cm Additional Notes Pt sitting position, sterile prep and drape negative paresthesia/heme

## 2018-07-21 NOTE — Plan of Care (Signed)

## 2018-07-21 NOTE — Anesthesia Postprocedure Evaluation (Signed)
Anesthesia Post Note  Patient: Troy Bowen  Procedure(s) Performed: BILATERAL ANTERIOR TOTAL HIP ARTHROPLASTY (Bilateral Hip)     Patient location during evaluation: PACU Anesthesia Type: Spinal Level of consciousness: awake and alert Pain management: pain level controlled Vital Signs Assessment: post-procedure vital signs reviewed and stable Respiratory status: spontaneous breathing and respiratory function stable Cardiovascular status: blood pressure returned to baseline and stable Postop Assessment: spinal receding Anesthetic complications: no    Last Vitals:  Vitals:   07/21/18 1730 07/21/18 1745  BP: 110/73 125/69  Pulse: 84 76  Resp: 14 12  Temp:    SpO2: 100% 100%    Last Pain:  Vitals:   07/21/18 1745  TempSrc:   PainSc: Asleep                 [name]

## 2018-07-21 NOTE — Anesthesia Procedure Notes (Signed)
Procedure Name: LMA Insertion Date/Time: 07/21/2018 4:00 PM Performed by: Heather Roberts, MD Pre-anesthesia Checklist: Patient identified, Emergency Drugs available, Suction available, Patient being monitored and Timeout performed Patient Re-evaluated:Patient Re-evaluated prior to induction Oxygen Delivery Method: Circle system utilized Preoxygenation: Pre-oxygenation with 100% oxygen Induction Type: IV induction Ventilation: Mask ventilation without difficulty LMA: LMA with gastric port inserted LMA Size: 4.0 Number of attempts: 1 Tube secured with: Tape Dental Injury: Teeth and Oropharynx as per pre-operative assessment

## 2018-07-21 NOTE — Op Note (Signed)
NAME: Troy Bowen, Troy Bowen [facility] MEDICAL RECORD [id number] ACCOUNT [id number] DATE OF BIRTH:08-05-1980 FACILITY: [facility] LOCATION: [id number] PHYSICIAN:Dayana Dalporto Aretha Parrot, MD  OPERATIVE REPORT  DATE OF PROCEDURE:  07/21/2018  PREOPERATIVE DIAGNOSIS:  Bilateral hip end-stage avascular necrosis.  POSTOPERATIVE DIAGNOSIS:  Bilateral hip end-stage avascular necrosis.  PROCEDURE:  Bilateral total hip arthroplasty through direct anterior approach.  IMPLANTS: 1.  Right side size 54 DePuy Sector Gription acetabular component with a 36+4 neutral polyethylene liner, size 7 high-offset ACTIS DePuy femoral component, size 36+8.5 metal hip ball. 2.  Left side size 54 DePuy Sector Gription acetabular component, size 36+4 neutral polyethylene liner, size 7 ACTIS DePuy high-offset femoral component, size 36+1.5 metal hip ball.  SURGEON:  Vanita Panda. Magnus Ivan, MD  ASSISTANT:  Richardean Canal, PA-C  ANESTHESIA: 1.  Spinal. 2.  Conversion to general via LMA.  ANTIBIOTICS:  Two grams IV Ancef.  ESTIMATED BLOOD LOSS:  400 mL.  COMPLICATIONS:  None.  INDICATIONS:  The patient is only a 38 year old gentleman with avascular necrosis of his bilateral hips with uncertain etiology.  At this point, it is end stage.  He has a large joint effusion bilaterally with femoral head collapse bilaterally.  His pain  is daily and has detrimentally affected his mobility, his quality of life, and his activities of daily living.  He is otherwise a healthy 39 year old gentleman, and we have talked about proceeding with bilateral total hip arthroplasty through direct  anterior approach.  I had a long and thorough discussion with him about the increase of acute blood loss anemia, nerve or vessel injury, fracture, infection, dislocation, DVT and implant failure.  He understands our goals are to decrease pain, improve  mobility and overall improve quality of life.  DESCRIPTION OF PROCEDURE:  After informed consent was  obtained, the appropriate right and left hips were marked.  He was brought to the operating room and sat up on the stretcher where spinal anesthesia was then obtained.  A Foley catheter was placed,  and both feet had traction boots applied to them.  Of note, his preoperative leg lengths were equal.  I then placed him supine on the Hana fracture table, the perineal post in place, and both legs in in-line skeletal traction device and no traction  applied.  His right and left operative hips were assessed, and the right hip was the one that was prepped and draped first with ChloraPrep and sterile drapes.  A time-out was called, and he was identified as correct patient, correct right hip to start  with.  We then made an incision just inferior and posterior to the anterior superior iliac spine and carried this obliquely down the leg.  We dissected down the tensor fascia lata muscle.  The tensor fascia was then divided longitudinally to proceed with  a direct anterior approach to the hip.  We identified and cauterized circumflex vessels and identified the hip capsule, opened up the hip capsule in an L-type format, finding a very large joint effusion.  We then placed Cobra retractors around the  medial and lateral femoral neck and made our femoral neck cut just proximal to the lesser trochanter, but this was definitely a lower femoral neck cut.  We completed this with an osteotome and was cutting it with an oscillating saw.  We placed a  corkscrew guide in the femoral head and removed the femoral head in its entirety and found a big flap of cartilage piece from his avascular necrosis.  I then cleaned the acetabulum remnants in  the acetabular labrum and other debris and placed a bent  Hohmann over the medial acetabular rim and then began reaming in stepwise increments from a size 44 reamer, going up to a size 53 with all reamers under direct visualization, the last reamer under direct fluoroscopy, so we could obtain  our depth of  reaming, our inclination and anteversion.  We then placed the real DePuy Sector Gription acetabular component size 54 and a 36+0 neutral polyethylene liner for that size acetabular component.  Attention was then turned to the femur.  With the leg  externally rotated to 120 degrees, extended and adducted, we placed the Mueller retractor medially and the Hohman retractor behind the greater trochanter, released the lateral joint capsule and used a box-cutting osteotome to enter the femoral canal and  a rongeur to lateralize.  We then began broaching from a size 0 broach using the DePuy ACTIS broaching system.  We broached all the way up to a size 7.  With the size 7 in place, we trialed a standard offset femoral neck and a 36+1.5 hip ball, reduced  this in the acetabulum, and it was significantly short.  I went up to a +8.5 hip ball, and he was still short, so I felt like we would try an offset femoral neck.  At that point, I felt it would be better to also change out his acetabular liner since we  had the chance, and so we went from a 0 liner to a 36+4 liner.  I then went with the real femoral component with a high-offset size 7 ACTIS stem and went with a 36+8.5 hip ball, reduced this in the acetabulum, and we were pleased with stability and range  of motion and his leg length, all assessed radiographically and clinically.  We then irrigated the soft tissue with normal saline solution and closed the joint capsule with interrupted #1 Ethibond suture, followed by running #1 Vicryl in the tensor  fascia, 0 Vicryl to close the deep tissue, 2-0 Vicryl to close the subcutaneous tissue, and interrupted staples on the skin.  Xeroform and Aquacel dressing applied.  We then talked to anesthesiology, and they felt that we could proceed because the blood  loss was only about 200 mL.  We then broke down the bed and sterile field, keeping the back table sterile.  We went around to the other side and reprepped  and draped with ChloraPrep and sterile drapes and brought our C-arm to the opposite side as the  well leg holder.  We then prepped that side with ChloraPrep and sterile drapes.  Time-out was called, and he was identified as correct patient again and the correct left hip.  We then made an incision just inferior and posterior to the anterior iliac  spine on this side and carried the same dissection all the way down as we did on the other side, doing all the same intervals.  During the case, the anesthesia felt that they were having a little bit of airway issues and just having a little bit of  obstructive sleep apnea, so they converted to an LMA.  There were no other difficulties during the case.  Once we got the hip exposed on the left side and we placed our trial 7 high-offset femoral component just like the other side, we decided to go with  a 54 acetabular component as well and a +4 liner for that size acetabular component after going through our stepwise reaming and then the broaching  on the femur side.  We went with the real 36+5 hip ball, which surprisingly I had difficulty reducing  that.  We saw that our neck cut, which is certainly higher on this side than the other side, so we went with a +1.5 metal hip ball, reduced this in the acetabulum, and we were pleased with the leg length, offset, range of motion, stability, and physical  exam as well.  We then closed this side similar to the other side with staples being the last closure on the skin.  A Xeroform well-padded sterile dressing was applied.  He was taken off of the Hana table and taken to recovery room in stable condition.   All final counts were correct.  There were no complications noted.  Of note, Rexene Edison, PA-C, assisted the entire case.  His assistance was crucial for facilitating all aspects of this case.  LN/NUANCE  D:07/21/2018 T:07/21/2018 JOB:[id number]

## 2018-07-21 NOTE — Anesthesia Preprocedure Evaluation (Signed)
Anesthesia Evaluation  Patient identified by MRN, date of birth, ID band Patient awake    Reviewed: Allergy & Precautions, NPO status , Patient's Chart, lab work & pertinent test results  History of Anesthesia Complications Negative for: history of anesthetic complications  Airway Mallampati: II  TM Distance: >3 FB Neck ROM: Full    Dental no notable dental hx. (+) Dental Advisory Given   Pulmonary former smoker,    Pulmonary exam normal        Cardiovascular negative cardio ROS Normal cardiovascular exam     Neuro/Psych PSYCHIATRIC DISORDERS Anxiety Depression negative neurological ROS     GI/Hepatic Neg liver ROS, GERD  Medicated and Controlled,  Endo/Other  negative endocrine ROS  Renal/GU negative Renal ROS  negative genitourinary   Musculoskeletal  (+) Arthritis ,   Abdominal   Peds negative pediatric ROS (+)  Hematology negative hematology ROS (+)   Anesthesia Other Findings   Reproductive/Obstetrics negative OB ROS                             Anesthesia Physical Anesthesia Plan  ASA: II  Anesthesia Plan: Spinal   Post-op Pain Management:    Induction:   PONV Risk Score and Plan: 2 and Ondansetron and Propofol infusion  Airway Management Planned: Simple Face Mask  Additional Equipment:   Intra-op Plan:   Post-operative Plan:   Informed Consent: I have reviewed the patients History and Physical, chart, labs and discussed the procedure including the risks, benefits and alternatives for the proposed anesthesia with the patient or authorized representative who has indicated his/her understanding and acceptance.     Dental advisory given  Plan Discussed with: CRNA and Anesthesiologist  Anesthesia Plan Comments:         Anesthesia Quick Evaluation

## 2018-07-21 NOTE — Transfer of Care (Signed)
Immediate Anesthesia Transfer of Care Note  Patient: Troy Bowen  Procedure(s) Performed: BILATERAL ANTERIOR TOTAL HIP ARTHROPLASTY (Bilateral Hip)  Patient Location: PACU  Anesthesia Type:Spinal  Level of Consciousness: awake, oriented, drowsy and patient cooperative  Airway & Oxygen Therapy: Patient Spontanous Breathing and Patient connected to face mask oxygen  Post-op Assessment: Report given to RN, Post -op Vital signs reviewed and stable and Patient moving all extremities  Post vital signs: Reviewed and stable  Last Vitals:  Vitals Value Taken Time  BP 121/82 07/21/2018  4:47 PM  Temp    Pulse 88 07/21/2018  4:50 PM  Resp 15 07/21/2018  4:50 PM  SpO2 100 % 07/21/2018  4:50 PM  Vitals shown include unvalidated device data.  Last Pain:  Vitals:   07/21/18 1020  TempSrc: Oral      Patients Stated Pain Goal: 4 (07/21/18 1040)  Complications: No apparent anesthesia complications

## 2018-07-21 NOTE — H&P (Signed)
TOTAL HIP ADMISSION H&P  Patient is admitted for bilaterally total hip arthroplasty.  Subjective:  Chief Complaint: bilaterally hip pain  HPI: Troy Bowen, 38 y.o. male, has a history of pain and functional disability in the bilaterally hip(s) due to avascular necrosis and patient has failed non-surgical conservative treatments for greater than 12 weeks to include NSAID's and/or analgesics and activity modification.  Onset of symptoms was abrupt starting 1 years ago with rapidlly worsening course since that time.The patient noted no past surgery on the bilaterally hip(s).  Patient currently rates pain in the bilaterally hip at 10 out of 10 with activity. Patient has night pain, worsening of pain with activity and weight bearing, trendelenberg gait and pain with passive range of motion. Patient has evidence of subchondral cysts by imaging studies. This condition presents safety issues increasing the risk of falls.  There is no current active infection.  Patient Active Problem List   Diagnosis Date Noted  . Avascular necrosis of bones of both hips (HCC) 07/21/2018  . Avascular necrosis of bone of hip, left (HCC) 06/22/2018  . Avascular necrosis of bone of hip, right (HCC) 06/22/2018  . Current episode of major depressive disorder without prior episode 01/30/2018  . Gastroesophageal reflux disease without esophagitis 09/29/2016   Past Medical History:  Diagnosis Date  . Anxiety   . Depression   . GERD (gastroesophageal reflux disease)     Past Surgical History:  Procedure Laterality Date  . APPENDECTOMY      No current facility-administered medications for this encounter.    Current Outpatient Medications  Medication Sig Dispense Refill Last Dose  . diclofenac (VOLTAREN) 75 MG EC tablet Take 1 tablet (75 mg total) by mouth 2 (two) times daily. Start AFTER finishing the prednisone (Patient taking differently: Take 75 mg by mouth daily. Start AFTER finishing the prednisone) 60  tablet 1   . escitalopram (LEXAPRO) 20 MG tablet Take 20 mg by mouth daily.     Marland Kitchen omeprazole (PRILOSEC) 20 MG capsule Take 20 mg by mouth daily.   5   . venlafaxine XR (EFFEXOR-XR) 75 MG 24 hr capsule Take 75 mg by mouth daily with breakfast.     . predniSONE (DELTASONE) 10 MG tablet 6 tabs po day 1, 5 tabs po day 2, 4 tabs po day 3, 3 tabs po day 4, 2 tabs po day 5, 1 tab po day 6 (Patient not taking: Reported on 07/07/2018) 21 tablet 0 Not Taking at Unknown time   No Known Allergies  Social History   Tobacco Use  . Smoking status: Former Smoker    Packs/day: 1.00    Years: 8.00    Pack years: 8.00    Types: Cigarettes    Last attempt to quit: 2005    Years since quitting: 15.1  . Smokeless tobacco: Never Used  Substance Use Topics  . Alcohol use: Yes    Alcohol/week: 6.0 standard drinks    Types: 6 Cans of beer per week    Comment: Daily    No family history on file.   Review of Systems  Musculoskeletal: Positive for joint pain.  All other systems reviewed and are negative.   Objective:  Physical Exam  Constitutional: He is oriented to person, place, and time. He appears well-developed and well-nourished.  HENT:  Head: Normocephalic and atraumatic.  Eyes: Pupils are equal, round, and reactive to light. EOM are normal.  Neck: Normal range of motion. Neck supple.  Cardiovascular: Normal rate and regular  rhythm.  Respiratory: Breath sounds normal.  GI: Soft. Bowel sounds are normal.  Musculoskeletal:     Right hip: He exhibits decreased range of motion, decreased strength, tenderness and bony tenderness.     Left hip: He exhibits decreased range of motion, decreased strength, tenderness and bony tenderness.  Neurological: He is alert and oriented to person, place, and time.  Skin: Skin is warm and dry.  Psychiatric: He has a normal mood and affect.    Vital signs in last 24 hours:    Labs:   Estimated body mass index is 32.39 kg/m as calculated from the  following:   Height as of 07/17/18: 5\' 11"  (1.803 m).   Weight as of 07/17/18: 105.3 kg.   Imaging Review Plain radiographs demonstrate severe avascular necrosis of the bilateral hip(s). The bone quality appears to be excellent for age and reported activity level.      Assessment/Plan:  End avascular necrosis, bilaterally hip(s)  The patient history, physical examination, clinical judgement of the provider and imaging studies are consistent with end stage AVN of the bilaterally hip(s) and total hip arthroplasty is deemed medically necessary. The treatment options including medical management, injection therapy, arthroscopy and arthroplasty were discussed at length. The risks and benefits of total hip arthroplasty were presented and reviewed. The risks due to aseptic loosening, infection, stiffness, dislocation/subluxation,  thromboembolic complications and other imponderables were discussed.  The patient acknowledged the explanation, agreed to proceed with the plan and consent was signed. Patient is being admitted for inpatient treatment for surgery, pain control, PT, OT, prophylactic antibiotics, VTE prophylaxis, progressive ambulation and ADL's and discharge planning.The patient is planning to be discharged home with home health services

## 2018-07-21 NOTE — Brief Op Note (Signed)
07/21/2018  4:20 PM  PATIENT:  Troy Bowen  38 y.o. male  PRE-OPERATIVE DIAGNOSIS:  bilateral hip avascular necrosis  POST-OPERATIVE DIAGNOSIS:  bilateral hip avascular necrosis  PROCEDURE:  Procedure(s): BILATERAL ANTERIOR TOTAL HIP ARTHROPLASTY (Bilateral)  SURGEON:  Surgeon(s) and Role:    Kathryne Hitch, MD - Primary  PHYSICIAN ASSISTANT:  Rexene Edison, PA-C  ANESTHESIA:   spinal and general  EBL:  400 mL   COUNTS:  YES  TOURNIQUET:  * No tourniquets in log *  DICTATION: .Other Dictation: Dictation Number [id number]  PLAN OF CARE: Admit to inpatient   PATIENT DISPOSITION:  PACU - hemodynamically stable.   Delay start of Pharmacological VTE agent (>24hrs) due to surgical blood loss or risk of bleeding: no

## 2018-07-22 LAB — BASIC METABOLIC PANEL
Anion gap: 8 (ref 5–15)
BUN: 14 mg/dL (ref 6–20)
CHLORIDE: 101 mmol/L (ref 98–111)
CO2: 25 mmol/L (ref 22–32)
Calcium: 8.4 mg/dL — ABNORMAL LOW (ref 8.9–10.3)
Creatinine, Ser: 0.97 mg/dL (ref 0.61–1.24)
GFR calc Af Amer: >60 mL/min (ref >60)
GFR calc non Af Amer: >60 mL/min (ref >60)
Glucose, Bld: 155 mg/dL — ABNORMAL HIGH (ref 70–99)
Potassium: 4 mmol/L (ref 3.5–5.1)
Sodium: 134 mmol/L — ABNORMAL LOW (ref 135–145)

## 2018-07-22 LAB — CBC
HCT: 35.7 % — ABNORMAL LOW (ref 39.0–52.0)
HEMOGLOBIN: 11 g/dL — AB (ref 13.0–17.0)
MCH: 28.9 pg (ref 26.0–34.0)
MCHC: 30.8 g/dL (ref 30.0–36.0)
MCV: 93.7 fL (ref 80.0–100.0)
Platelets: 213 10*3/uL (ref 150–400)
RBC: 3.81 MIL/uL — AB (ref 4.22–5.81)
RDW: 13.1 % (ref 11.5–15.5)
WBC: 7.5 10*3/uL (ref 4.0–10.5)
nRBC: 0 % (ref 0.0–0.2)

## 2018-07-22 MED ORDER — CYCLOBENZAPRINE HCL 5 MG PO TABS
5.0000 mg | ORAL_TABLET | Freq: Three times a day (TID) | ORAL | Status: DC | PRN
  Administered 2018-07-22 – 2018-07-23 (×2): 5 mg via ORAL
  Filled 2018-07-22 (×2): qty 1

## 2018-07-22 MED ORDER — METOPROLOL TARTRATE 25 MG PO TABS
25.0000 mg | ORAL_TABLET | Freq: Two times a day (BID) | ORAL | Status: DC
  Administered 2018-07-22 – 2018-07-23 (×3): 25 mg via ORAL
  Filled 2018-07-22 (×3): qty 1

## 2018-07-22 MED ORDER — HYDROMORPHONE HCL 2 MG PO TABS
2.0000 mg | ORAL_TABLET | ORAL | Status: DC | PRN
  Administered 2018-07-22 – 2018-07-24 (×7): 2 mg via ORAL
  Filled 2018-07-22 (×7): qty 1

## 2018-07-22 NOTE — Progress Notes (Signed)
Physical Therapy Treatment Patient Details Name: Troy Bowen MRN: [id number] DOB: August 17, 1980 Today's Date: 07/22/2018    History of Present Illness Pt s/p Bil THR    PT Comments    Pt continues cooperative but ltd by fatigue and pain.     Follow Up Recommendations  Follow surgeon's recommendation for DC plan and follow-up therapies     Equipment Recommendations  Rolling walker with 5" wheels;3in1 (PT)    Recommendations for Other Services OT consult     Precautions / Restrictions Precautions Precautions: Fall Restrictions Weight Bearing Restrictions: No Other Position/Activity Restrictions: WBAT    Mobility  Bed Mobility Overal bed mobility: Needs Assistance Bed Mobility: Sit to Supine     Supine to sit: Mod assist;+2 for physical assistance;+2 for safety/equipment Sit to supine: Mod assist;+2 for physical assistance;+2 for safety/equipment;HOB elevated   General bed mobility comments: Increased time with cues for sequence and assist to manage bil LE, to control trunk and to complete transition to EOB   Transfers Overall transfer level: Needs assistance Equipment used: Rolling walker (2 wheeled) Transfers: Sit to/from Stand Sit to Stand: Mod assist;+2 physical assistance;+2 safety/equipment;From elevated surface         General transfer comment: cues for LE management, use of UEs to self assist  Ambulation/Gait Ambulation/Gait assistance: Mod assist;+2 physical assistance;+2 safety/equipment Gait Distance (Feet): 3 Feet Assistive device: Rolling walker (2 wheeled) Gait Pattern/deviations: Step-to pattern;Decreased step length - right;Decreased step length - left;Shuffle;Trunk flexed Gait velocity: decr   General Gait Details: cues for sequence, posture and position from RW; physical assist to manage RW, and for balance/support   Stairs             Wheelchair Mobility    Modified Rankin (Stroke Patients Only)       Balance Overall  balance assessment: Needs assistance Sitting-balance support: Feet supported;Bilateral upper extremity supported Sitting balance-Leahy Scale: Fair     Standing balance support: Bilateral upper extremity supported Standing balance-Leahy Scale: Poor                              Cognition Arousal/Alertness: Awake/alert Behavior During Therapy: WFL for tasks assessed/performed Overall Cognitive Status: Within Functional Limits for tasks assessed                                        Exercises Total Joint Exercises Ankle Circles/Pumps: AROM;Both;15 reps;Supine    General Comments        Pertinent Vitals/Pain Pain Assessment: 0-10 Pain Score: 7  Pain Location: bil hips/thighs Pain Descriptors / Indicators: Aching;Burning Pain Intervention(s): Limited activity within patient's tolerance;Monitored during session;Premedicated before session;Ice applied    Home Living Family/patient expects to be discharged to:: Unsure Living Arrangements: Spouse/significant other(Spouse back to work Monday) Available Help at Discharge: Family Type of Home: House Home Access: Stairs to enter Entrance Stairs-Rails: Right Home Layout: One level Home Equipment: None      Prior Function Level of Independence: Independent          PT Goals (current goals can now be found in the care plan section) Acute Rehab PT Goals Patient Stated Goal: Regain IND PT Goal Formulation: With patient Time For Goal Achievement: 07/29/18 Potential to Achieve Goals: Good Progress towards PT goals: Progressing toward goals    Frequency    7X/week      PT  Plan Current plan remains appropriate    Co-evaluation              AM-PAC PT "6 Clicks" Mobility   Outcome Measure  Help needed turning from your back to your side while in a flat bed without using bedrails?: A Lot Help needed moving from lying on your back to sitting on the side of a flat bed without using  bedrails?: A Lot Help needed moving to and from a bed to a chair (including a wheelchair)?: A Lot Help needed standing up from a chair using your arms (e.g., wheelchair or bedside chair)?: A Lot Help needed to walk in hospital room?: A Lot Help needed climbing 3-5 steps with a railing? : Total 6 Click Score: 11    End of Session Equipment Utilized During Treatment: Gait belt Activity Tolerance: Patient limited by pain Patient left: in bed;with call bell/phone within reach;with family/visitor present Nurse Communication: Mobility status PT Visit Diagnosis: Difficulty in walking, not elsewhere classified (R26.2)     Time: 1510-1530 PT Time Calculation (min) (ACUTE ONLY): 20 min  Charges:  $Gait Training: 8-22 mins                     Troy Bowen PT Acute Rehabilitation Services Pager [phone] Office [phone]    Troy Bowen 07/22/2018, 4:21 PM

## 2018-07-22 NOTE — Care Management Note (Signed)
Case Management Note  Patient Details  Name: Troy Bowen MRN: [id number] Date of Birth: 1981/04/06  Subjective/Objective:    Pt s/p   Bilateral hip replacement              Action/Plan: Spoke to pt and offered choice for HH/medicare list and placed on chart. Pt agreeable to [facility] for HH. Contacted [facility] for RW and 3n1 bedside commode for home to be delivered to room prior to dc.    Expected Discharge Date:                Expected Discharge Plan:  Home w Home Health Services  In-House Referral:  NA  Discharge planning Services  CM Consult  Post Acute Care Choice:  Home Health Choice offered to:  Patient  DME Arranged:  3-N-1, Walker rolling DME Agency:  [facility]  HH Arranged:  PT HH Agency:  [facility] ([facility]  Status of Service:  Completed, signed off  If discussed at [employer], dates discussed:    Additional Comments:  Elliot Cousin, RN 07/22/2018, 1:04 PM

## 2018-07-22 NOTE — Evaluation (Signed)
Physical Therapy Evaluation Patient Details Name: Troy Bowen MRN: [id number] DOB: [birth year] Today's Date: 07/22/2018   History of Present Illness  Pt s/p Bil THR  Clinical Impression  Pt s/p bil THR and presents with decreased bil LE strength/ROM and post op pain limiting functional mobility.  Pt hopes to progress to dc home with assist of spouse but states spouse must return to work soon and he may have to consider rehab at a facility.    Follow Up Recommendations Follow surgeon's recommendation for DC plan and follow-up therapies    Equipment Recommendations  Rolling walker with 5" wheels;3in1 (PT)    Recommendations for Other Services OT consult     Precautions / Restrictions Precautions Precautions: Fall Restrictions Weight Bearing Restrictions: No Other Position/Activity Restrictions: WBAT      Mobility  Bed Mobility Overal bed mobility: Needs Assistance Bed Mobility: Supine to Sit     Supine to sit: Mod assist;+2 for physical assistance;+2 for safety/equipment     General bed mobility comments: Increased time with cues for sequence and assist to manage bil LE, to control trunk and to complete transition to EOB   Transfers Overall transfer level: Needs assistance   Transfers: Sit to/from Stand Sit to Stand: Mod assist;+2 physical assistance;+2 safety/equipment;From elevated surface         General transfer comment: cues for LE management, use of UEs to self assist  Ambulation/Gait Ambulation/Gait assistance: Mod assist;+2 physical assistance;+2 safety/equipment Gait Distance (Feet): 7 Feet Assistive device: Rolling walker (2 wheeled) Gait Pattern/deviations: Step-to pattern;Decreased step length - right;Decreased step length - left;Shuffle;Trunk flexed Gait velocity: decr   General Gait Details: cues for sequence, posture and position from RW; physical assist to manage RW, and for balance/support  Stairs            Wheelchair Mobility     Modified Rankin (Stroke Patients Only)       Balance Overall balance assessment: Needs assistance Sitting-balance support: Feet supported;Bilateral upper extremity supported Sitting balance-Leahy Scale: Fair     Standing balance support: Bilateral upper extremity supported Standing balance-Leahy Scale: Poor                               Pertinent Vitals/Pain Pain Assessment: 0-10 Pain Score: 6  Pain Location: bil hips/thighs Pain Descriptors / Indicators: Aching;Burning Pain Intervention(s): Limited activity within patient's tolerance;Monitored during session;Premedicated before session;Ice applied    Home Living Family/patient expects to be discharged to:: Unsure Living Arrangements: Spouse/significant other(Spouse back to work Monday) Available Help at Discharge: Family Type of Home: House Home Access: Stairs to enter Entrance Stairs-Rails: Right Entrance Stairs-Number of Steps: 4 Home Layout: One level Home Equipment: None      Prior Function Level of Independence: Independent               Hand Dominance        Extremity/Trunk Assessment   Upper Extremity Assessment Upper Extremity Assessment: Overall WFL for tasks assessed    Lower Extremity Assessment Lower Extremity Assessment: RLE deficits/detail;LLE deficits/detail    Cervical / Trunk Assessment Cervical / Trunk Assessment: Normal  Communication   Communication: No difficulties  Cognition Arousal/Alertness: Awake/alert Behavior During Therapy: WFL for tasks assessed/performed Overall Cognitive Status: Within Functional Limits for tasks assessed  General Comments      Exercises Total Joint Exercises Ankle Circles/Pumps: AROM;Both;15 reps;Supine   Assessment/Plan    PT Assessment Patient needs continued PT services  PT Problem List Decreased strength;Decreased range of motion;Decreased activity tolerance;Decreased  balance;Decreased mobility;Decreased knowledge of use of DME;Pain       PT Treatment Interventions DME instruction;Gait training;Stair training;Functional mobility training;Therapeutic activities;Therapeutic exercise;Patient/family education    PT Goals (Current goals can be found in the Care Plan section)  Acute Rehab PT Goals Patient Stated Goal: Regain IND PT Goal Formulation: With patient Time For Goal Achievement: 07/29/18 Potential to Achieve Goals: Good    Frequency 7X/week   Barriers to discharge Decreased caregiver support Pt states wife has to return to work Monday    Co-evaluation               AM-PAC PT "6 Clicks" Mobility  Outcome Measure Help needed turning from your back to your side while in a flat bed without using bedrails?: A Lot Help needed moving from lying on your back to sitting on the side of a flat bed without using bedrails?: A Lot Help needed moving to and from a bed to a chair (including a wheelchair)?: A Lot Help needed standing up from a chair using your arms (e.g., wheelchair or bedside chair)?: A Lot Help needed to walk in hospital room?: A Lot Help needed climbing 3-5 steps with a railing? : Total 6 Click Score: 11    End of Session Equipment Utilized During Treatment: Gait belt Activity Tolerance: Patient limited by pain Patient left: in chair;with call bell/phone within reach;with family/visitor present Nurse Communication: Mobility status PT Visit Diagnosis: Difficulty in walking, not elsewhere classified (R26.2)    Time: 1610-9604 PT Time Calculation (min) (ACUTE ONLY): 24 min   Charges:   PT Evaluation $PT Eval Low Complexity: 1 Low PT Treatments $Gait Training: 8-22 mins        Mauro Kaufmann PT Acute Rehabilitation Services Pager [phone] Office [phone]   Ronal Maybury 07/22/2018, 2:45 PM

## 2018-07-22 NOTE — Plan of Care (Signed)

## 2018-07-22 NOTE — Progress Notes (Signed)
Subjective: 1 Day Post-Op Procedure(s) (LRB): BILATERAL ANTERIOR TOTAL HIP ARTHROPLASTY (Bilateral) Patient reports pain as severe.    Objective: Vital signs in last 24 hours: Temp:  [97.8 F (36.6 C)-99.3 F (37.4 C)] 99.3 F (37.4 C) (03/07 0445) Pulse Rate:  [75-109] 109 (03/07 0445) Resp:  [11-18] 17 (03/07 0445) BP: (103-145)/(69-97) 133/93 (03/07 0445) SpO2:  [95 %-100 %] 99 % (03/07 0445) Weight:  [105.3 kg] 105.3 kg (03/06 1040)  Intake/Output from previous day: 03/06 0701 - 03/07 0700 In: 4529.3 [P.O.:1160; I.V.:3219.3; IV Piggyback:150] Out: 1825 [Urine:1325; Blood:500] Intake/Output this shift: No intake/output data recorded.  Recent Labs    07/22/18 0342  HGB 11.0*   Recent Labs    07/22/18 0342  WBC 7.5  RBC 3.81*  HCT 35.7*  PLT 213   Recent Labs    07/22/18 0342  NA 134*  K 4.0  CL 101  CO2 25  BUN 14  CREATININE 0.97  GLUCOSE 155*  CALCIUM 8.4*   No results for input(s): LABPT, INR in the last 72 hours.  Sensation intact distally Intact pulses distally Dorsiflexion/Plantar flexion intact Incision: dressing C/D/I   Assessment/Plan: 1 Day Post-Op Procedure(s) (LRB): BILATERAL ANTERIOR TOTAL HIP ARTHROPLASTY (Bilateral) Up with therapy Discharge home with home health likely Monday      Kathryne Hitch 07/22/2018, 9:27 AM

## 2018-07-22 NOTE — Plan of Care (Signed)
  Problem: Pain Management: Goal: Pain level will decrease with appropriate interventions Outcome: Progressing   Problem: Education: Goal: Knowledge of the prescribed therapeutic regimen will improve Outcome: Progressing   Problem: Skin Integrity: Goal: Risk for impaired skin integrity will decrease Outcome: Progressing   Problem: Safety: Goal: Ability to remain free from injury will improve Outcome: Progressing   Problem: Pain Managment: Goal: General experience of comfort will improve Outcome: Progressing

## 2018-07-22 NOTE — Discharge Instructions (Signed)

## 2018-07-23 MED ORDER — TIZANIDINE HCL 4 MG PO TABS
4.0000 mg | ORAL_TABLET | Freq: Three times a day (TID) | ORAL | Status: DC | PRN
  Administered 2018-07-23 (×2): 4 mg via ORAL
  Filled 2018-07-23 (×2): qty 1

## 2018-07-23 NOTE — Progress Notes (Signed)
Subjective: 2 Days Post-Op Procedure(s) (LRB): BILATERAL ANTERIOR TOTAL HIP ARTHROPLASTY (Bilateral) Patient reports pain as moderate.  A little better than yesterday.  I did start a beta blocker given his hypertension and tachycardia with a stable Hgb/Hct.  Feels indigested today.  Objective: Vital signs in last 24 hours: Temp:  [98.4 F (36.9 C)-99.1 F (37.3 C)] 98.4 F (36.9 C) (03/07 2245) Pulse Rate:  [103-123] 103 (03/08 0517) Resp:  [16-17] 17 (03/07 2245) BP: (140-165)/(95-108) 146/95 (03/08 0517) SpO2:  [91 %-100 %] 100 % (03/08 0517)  Intake/Output from previous day: 03/07 0701 - 03/08 0700 In: 2101.6 [P.O.:360; I.V.:1741.6] Out: 1250 [Urine:1250] Intake/Output this shift: No intake/output data recorded.  Recent Labs    07/22/18 0342  HGB 11.0*   Recent Labs    07/22/18 0342  WBC 7.5  RBC 3.81*  HCT 35.7*  PLT 213   Recent Labs    07/22/18 0342  NA 134*  K 4.0  CL 101  CO2 25  BUN 14  CREATININE 0.97  GLUCOSE 155*  CALCIUM 8.4*   No results for input(s): LABPT, INR in the last 72 hours.  Sensation intact distally Intact pulses distally Dorsiflexion/Plantar flexion intact Incision: scant drainage   Assessment/Plan: 2 Days Post-Op Procedure(s) (LRB): BILATERAL ANTERIOR TOTAL HIP ARTHROPLASTY (Bilateral) Up with therapy Discharge home with home health likely late Monday vs Tuesday.      Kathryne Hitch 07/23/2018, 9:49 AM

## 2018-07-23 NOTE — Progress Notes (Signed)
Physical Therapy Treatment Patient Details Name: Troy Bowen MRN: [id number] DOB: 1980-07-26 Today's Date: 07/23/2018    History of Present Illness Pt s/p Bil THR    PT Comments    Pt cooperative but continues to require significant assist but with improvement noted in all mobility tasks.  Follow Up Recommendations  Follow surgeon's recommendation for DC plan and follow-up therapies     Equipment Recommendations  Rolling walker with 5" wheels;3in1 (PT)    Recommendations for Other Services OT consult     Precautions / Restrictions Precautions Precautions: Fall Restrictions Weight Bearing Restrictions: No Other Position/Activity Restrictions: WBAT    Mobility  Bed Mobility Overal bed mobility: Needs Assistance Bed Mobility: Supine to Sit     Supine to sit: Min assist;Mod assist;+2 for physical assistance;+2 for safety/equipment Sit to supine: Mod assist;+2 for physical assistance;+2 for safety/equipment;HOB elevated   General bed mobility comments: Increased time with cues for sequence and assist to manage bil LE,  Transfers Overall transfer level: Needs assistance Equipment used: Rolling walker (2 wheeled) Transfers: Sit to/from Stand Sit to Stand: Min assist;Mod assist;+2 physical assistance;+2 safety/equipment;From elevated surface         General transfer comment: cues for LE management, use of UEs to self assist  Ambulation/Gait Ambulation/Gait assistance: Min assist;+2 safety/equipment Gait Distance (Feet): 12 Feet Assistive device: Rolling walker (2 wheeled) Gait Pattern/deviations: Step-to pattern;Decreased step length - right;Decreased step length - left;Shuffle;Trunk flexed Gait velocity: decr   General Gait Details: cues for sequence, posture and position from RW; physical assist to manage RW, and for balance/support   Stairs             Wheelchair Mobility    Modified Rankin (Stroke Patients Only)       Balance Overall  balance assessment: Needs assistance Sitting-balance support: Feet supported;Bilateral upper extremity supported Sitting balance-Leahy Scale: Good     Standing balance support: Bilateral upper extremity supported Standing balance-Leahy Scale: Poor                              Cognition Arousal/Alertness: Awake/alert Behavior During Therapy: WFL for tasks assessed/performed Overall Cognitive Status: Within Functional Limits for tasks assessed                                        Exercises Total Joint Exercises Ankle Circles/Pumps: AROM;Both;15 reps;Supine Quad Sets: AROM;Both;10 reps;Supine Heel Slides: AAROM;Both;15 reps;Supine Hip ABduction/ADduction: AAROM;Both;10 reps;Supine    General Comments        Pertinent Vitals/Pain Pain Assessment: 0-10 Pain Score: 6  Pain Location: bil hips/thighs Pain Descriptors / Indicators: Aching;Burning Pain Intervention(s): Limited activity within patient's tolerance;Monitored during session;Premedicated before session;Ice applied    Home Living Family/patient expects to be discharged to:: Unsure(between home and rehab) Living Arrangements: Spouse/significant other(spouse back to work [name]) Available Help at Discharge: Family Type of Home: House Home Access: Stairs to enter Entrance Stairs-Rails: Right Home Layout: One level Home Equipment: None      Prior Function Level of Independence: Independent          PT Goals (current goals can now be found in the care plan section) Acute Rehab PT Goals Patient Stated Goal: Regain IND PT Goal Formulation: With patient Time For Goal Achievement: 07/29/18 Potential to Achieve Goals: Good Progress towards PT goals: Progressing toward goals    Frequency  7X/week      PT Plan Current plan remains appropriate    Co-evaluation PT/OT/SLP Co-Evaluation/Treatment: Yes Reason for Co-Treatment: For patient/therapist safety PT goals addressed during  session: Mobility/safety with mobility OT goals addressed during session: ADL's and self-care      AM-PAC PT "6 Clicks" Mobility   Outcome Measure  Help needed turning from your back to your side while in a flat bed without using bedrails?: A Lot Help needed moving from lying on your back to sitting on the side of a flat bed without using bedrails?: A Lot Help needed moving to and from a bed to a chair (including a wheelchair)?: A Lot Help needed standing up from a chair using your arms (e.g., wheelchair or bedside chair)?: A Lot Help needed to walk in hospital room?: A Lot Help needed climbing 3-5 steps with a railing? : Total 6 Click Score: 11    End of Session Equipment Utilized During Treatment: Gait belt Activity Tolerance: Patient tolerated treatment well;Patient limited by pain;Patient limited by fatigue Patient left: in chair;with call bell/phone within reach;with family/visitor present Nurse Communication: Mobility status PT Visit Diagnosis: Difficulty in walking, not elsewhere classified (R26.2)     Time: 1040-1055 PT Time Calculation (min) (ACUTE ONLY): 15 min  Charges:  $Gait Training: 8-22 mins $Therapeutic Exercise: 8-22 mins                     Mauro Kaufmann PT Acute Rehabilitation Services Pager [phone] Office [phone]    [facility] 07/23/2018, 12:37 PM

## 2018-07-23 NOTE — Evaluation (Signed)
Occupational Therapy Evaluation Patient Details Name: Troy Bowen MRN: [id number] DOB: [birth year] Today's Date: 07/23/2018    History of Present Illness Pt s/p Bil THR   Clinical Impression   Pt admitted with above diagnoses, pain limiting ability to complete BADL at desired level of ind. PTA pt ind in daily activities, states socks/pants donning was becoming increasingly difficult. Pt currently min-mod A +2 for bed mobility, and min-mod A +2 for sit >stand. Pt able to tolerate 50% functional mobility at min A +2 to BR (chair ride back). LB ADL equipment introduced to both pt and wife, deferred trial this date due to pain limiting. Educated pt on use of BSC over toilet and in tub shower. Pt and wife between rehab vs HHOT considering she must return to work and cannot provide assist 24/7. Recommending SNF vs HHOT depending on progress to increase independent engagement and safety in BADL. Equipment needs met pending d/c, if home pt needing LB ADL equip [facility] delivered).    Follow Up Recommendations  Home health OT;SNF;Other (comment)(vs pending pt progress and comfort)    Equipment Recommendations  Other (comment)(pending dispo)    Recommendations for Other Services       Precautions / Restrictions Precautions Precautions: Fall Restrictions Weight Bearing Restrictions: No Other Position/Activity Restrictions: WBAT      Mobility Bed Mobility Overal bed mobility: Needs Assistance Bed Mobility: Supine to Sit     Supine to sit: Min assist;Mod assist;+2 for physical assistance;+2 for safety/equipment Sit to supine: Mod assist;+2 for physical assistance;+2 for safety/equipment;HOB elevated   General bed mobility comments: Increased time with cues for sequence and assist to manage bil LE,  Transfers Overall transfer level: Needs assistance Equipment used: Rolling walker (2 wheeled) Transfers: Sit to/from Stand Sit to Stand: Min assist;Mod assist;+2 physical assistance;+2  safety/equipment;From elevated surface         General transfer comment: cues for LE management, use of UEs to self assist    Balance Overall balance assessment: Needs assistance Sitting-balance support: Feet supported;Bilateral upper extremity supported Sitting balance-Leahy Scale: Good     Standing balance support: Bilateral upper extremity supported Standing balance-Leahy Scale: Poor                             ADL either performed or assessed with clinical judgement   ADL Overall ADL's : Needs assistance/impaired Eating/Feeding: Independent   Grooming: Set up;Sitting Grooming Details (indicate cue type and reason): unable to tolerate standing this date Upper Body Bathing: Modified independent;Sitting   Lower Body Bathing: Sit to/from stand;Sitting/lateral leans;Cueing for compensatory techniques;With adaptive equipment;Maximal assistance Lower Body Bathing Details (indicate cue type and reason): pain limiting Upper Body Dressing : Modified independent;Sitting   Lower Body Dressing: Maximal assistance;Cueing for compensatory techniques;With adaptive equipment;Sit to/from stand;Sitting/lateral leans   Toilet Transfer: Maximal assistance;BSC;RW;Regular Glass blower/designer Details (indicate cue type and reason): unable to tolerate this date, pain limiting Toileting- Clothing Manipulation and Hygiene: Minimal assistance;Sit to/from stand;Sitting/lateral lean;Cueing for compensatory techniques   Tub/ Shower Transfer: Maximal assistance;Shower seat;3 in [employer] walker   Functional mobility during ADLs: Minimal assistance;+2 for physical assistance;+2 for safety/equipment;Rolling walker General ADL Comments: pain limiting this date, introduced AE/DME for further pt use     Vision Baseline Vision/History: Wears glasses Wears Glasses: At all times Patient Visual Report: No change from baseline       Perception     Praxis      Pertinent Vitals/Pain Pain  Assessment: 0-10 Pain Score: 6  Pain Location: bil hips/thighs Pain Descriptors / Indicators: Aching;Burning Pain Intervention(s): Limited activity within patient's tolerance;Monitored during session;Premedicated before session;Ice applied     Hand Dominance     Extremity/Trunk Assessment Upper Extremity Assessment Upper Extremity Assessment: Overall WFL for tasks assessed   Lower Extremity Assessment Lower Extremity Assessment: Defer to PT evaluation       Communication Communication Communication: No difficulties   Cognition Arousal/Alertness: Awake/alert Behavior During Therapy: WFL for tasks assessed/performed Overall Cognitive Status: Within Functional Limits for tasks assessed                                     General Comments       Exercises   Shoulder Instructions      Home Living Family/patient expects to be discharged to:: Unsure(between home and rehab) Living Arrangements: Spouse/significant other(spouse back to work [employer]) Available Help at Discharge: Family Type of Home: House Home Access: Stairs to enter Technical brewer of Steps: 4 Entrance Stairs-Rails: Right Home Layout: One level     Bathroom Shower/Tub: Teacher, early years/pre: Standard     Home Equipment: None          Prior Functioning/Environment Level of Independence: Independent                 OT Problem List: Decreased activity tolerance;Decreased knowledge of use of DME or AE;Impaired balance (sitting and/or standing);Pain      OT Treatment/Interventions: Self-care/ADL training;DME and/or AE instruction;Therapeutic activities;Balance training;Therapeutic exercise;Patient/family education    OT Goals(Current goals can be found in the care plan section) Acute Rehab OT Goals Patient Stated Goal: return to ind OT Goal Formulation: With patient/family Time For Goal Achievement: 08/06/18 Potential to Achieve Goals: Good  OT Frequency: Min  2X/week   Barriers to D/C: Decreased caregiver support  wife needing to return to work, cannot stay home with pt       Co-evaluation   Reason for Co-Treatment: For patient/therapist safety PT goals addressed during session: Mobility/safety with mobility OT goals addressed during session: ADL's and self-care      AM-PAC OT "6 Clicks" Daily Activity     Outcome Measure Help from another person eating meals?: None Help from another person taking care of personal grooming?: None(sitting) Help from another person toileting, which includes using toliet, bedpan, or urinal?: A Lot Help from another person bathing (including washing, rinsing, drying)?: A Lot Help from another person to put on and taking off regular upper body clothing?: None Help from another person to put on and taking off regular lower body clothing?: A Lot 6 Click Score: 18   End of Session Equipment Utilized During Treatment: Gait belt;Rolling walker  Activity Tolerance: Patient tolerated treatment well Patient left: in chair;with call bell/phone within reach;with family/visitor present  OT Visit Diagnosis: Other abnormalities of gait and mobility (R26.89);Pain Pain - Right/Left: (bil) Pain - part of body: Hip                Time: 1036-1101 OT Time Calculation (min): 25 min Charges:  OT General Charges $OT Visit: 1 Visit OT Evaluation $OT Eval Low Complexity: 1 Low  Zenovia Jarred, MSOT, OTR/L Behavioral Health OT/ Acute Relief OT [facility] Office: [phone]  Zenovia Jarred 07/23/2018, 1:14 PM

## 2018-07-23 NOTE — Plan of Care (Signed)

## 2018-07-23 NOTE — Progress Notes (Signed)
Physical Therapy Treatment Patient Details Name: Troy Bowen MRN: [id number] DOB: [birth year] Today's Date: 07/23/2018    History of Present Illness Pt s/p Bil THR    PT Comments    Pt fatigued this pm but with improved pain control and continued improvement in all mobility tasks.  Pt hopes to progress to dc home with HHPT follow up but has concerns regarding current abilities and level of assist available at home with spouse who works.   Follow Up Recommendations  Follow surgeon's recommendation for DC plan and follow-up therapies     Equipment Recommendations  Rolling walker with 5" wheels;3in1 (PT)    Recommendations for Other Services OT consult     Precautions / Restrictions Precautions Precautions: Fall Restrictions Weight Bearing Restrictions: No Other Position/Activity Restrictions: WBAT    Mobility  Bed Mobility Overal bed mobility: Needs Assistance Bed Mobility: Sit to Supine     Supine to sit: Min assist;Mod assist;+2 for physical assistance;+2 for safety/equipment Sit to supine: Mod assist   General bed mobility comments: Increased time with cues for sequence and assist to manage bil LE,  Transfers Overall transfer level: Needs assistance Equipment used: Rolling walker (2 wheeled) Transfers: Sit to/from Stand Sit to Stand: Min assist;Mod assist;+2 physical assistance;+2 safety/equipment;From elevated surface         General transfer comment: cues for LE management, use of UEs to self assist  Ambulation/Gait Ambulation/Gait assistance: Min assist Gait Distance (Feet): 14 Feet Assistive device: Rolling walker (2 wheeled) Gait Pattern/deviations: Step-to pattern;Decreased step length - right;Decreased step length - left;Shuffle;Trunk flexed Gait velocity: decr   General Gait Details: cues for sequence, posture and position from RW; physical assist to manage RW, and for balance/support   Stairs             Wheelchair Mobility     Modified Rankin (Stroke Patients Only)       Balance Overall balance assessment: Needs assistance Sitting-balance support: Feet supported;Bilateral upper extremity supported Sitting balance-Leahy Scale: Good     Standing balance support: Bilateral upper extremity supported Standing balance-Leahy Scale: Poor                              Cognition Arousal/Alertness: Awake/alert Behavior During Therapy: WFL for tasks assessed/performed Overall Cognitive Status: Within Functional Limits for tasks assessed                                        Exercises      General Comments        Pertinent Vitals/Pain Pain Assessment: 0-10 Pain Score: 4  Pain Location: bil hips/thighs Pain Descriptors / Indicators: Aching;Burning Pain Intervention(s): Limited activity within patient's tolerance;Monitored during session;Premedicated before session;Ice applied    Home Living Family/patient expects to be discharged to:: Unsure(between home and rehab) Living Arrangements: Spouse/significant other(spouse back to work [name]) Available Help at Discharge: Family Type of Home: House Home Access: Stairs to enter Entrance Stairs-Rails: Right Home Layout: One level Home Equipment: None      Prior Function Level of Independence: Independent          PT Goals (current goals can now be found in the care plan section) Acute Rehab PT Goals Patient Stated Goal: return to ind PT Goal Formulation: With patient Time For Goal Achievement: 07/29/18 Potential to Achieve Goals: Good Progress towards PT goals: Progressing  toward goals    Frequency    7X/week      PT Plan Current plan remains appropriate    Co-evaluation PT/OT/SLP Co-Evaluation/Treatment: Yes Reason for Co-Treatment: For patient/therapist safety PT goals addressed during session: Mobility/safety with mobility OT goals addressed during session: ADL's and self-care      AM-PAC PT "6  Clicks" Mobility   Outcome Measure  Help needed turning from your back to your side while in a flat bed without using bedrails?: A Lot Help needed moving from lying on your back to sitting on the side of a flat bed without using bedrails?: A Lot Help needed moving to and from a bed to a chair (including a wheelchair)?: A Lot Help needed standing up from a chair using your arms (e.g., wheelchair or bedside chair)?: A Lot Help needed to walk in hospital room?: A Lot Help needed climbing 3-5 steps with a railing? : Total 6 Click Score: 11    End of Session Equipment Utilized During Treatment: Gait belt Activity Tolerance: Patient tolerated treatment well;Patient limited by fatigue Patient left: in bed;with call bell/phone within reach Nurse Communication: Mobility status PT Visit Diagnosis: Difficulty in walking, not elsewhere classified (R26.2)     Time: 6546-5035 PT Time Calculation (min) (ACUTE ONLY): 14 min  Charges:  $Gait Training: 8-22 mins                     Mauro Kaufmann PT Acute Rehabilitation Services Pager [phone] Office [phone]    Jeanifer Halliday 07/23/2018, 2:49 PM

## 2018-07-23 NOTE — Progress Notes (Signed)
Physical Therapy Treatment Patient Details Name: Troy Bowen MRN: [id number] DOB: Oct 07, 1980 Today's Date: 07/23/2018    History of Present Illness Pt s/p Bil THR    PT Comments    Initiated therex program bil LEs; OOB deferred to arrival of OT for co-tx.   Follow Up Recommendations  Follow surgeon's recommendation for DC plan and follow-up therapies     Equipment Recommendations  Rolling walker with 5" wheels;3in1 (PT)    Recommendations for Other Services OT consult     Precautions / Restrictions Precautions Precautions: Fall Restrictions Weight Bearing Restrictions: No Other Position/Activity Restrictions: WBAT    Mobility  Bed Mobility Overal bed mobility: Needs Assistance Bed Mobility: Sit to Supine     Supine to sit: Mod assist;+2 for physical assistance;+2 for safety/equipment Sit to supine: Mod assist;+2 for physical assistance;+2 for safety/equipment;HOB elevated      Transfers                    Ambulation/Gait                 Stairs             Wheelchair Mobility    Modified Rankin (Stroke Patients Only)       Balance                                            Cognition Arousal/Alertness: Awake/alert Behavior During Therapy: WFL for tasks assessed/performed Overall Cognitive Status: Within Functional Limits for tasks assessed                                        Exercises Total Joint Exercises Ankle Circles/Pumps: AROM;Both;15 reps;Supine Quad Sets: AROM;Both;10 reps;Supine Heel Slides: AAROM;Both;15 reps;Supine Hip ABduction/ADduction: AAROM;Both;10 reps;Supine    General Comments        Pertinent Vitals/Pain Pain Assessment: 0-10 Pain Score: 6  Pain Location: bil hips/thighs Pain Descriptors / Indicators: Aching;Burning Pain Intervention(s): Limited activity within patient's tolerance;Monitored during session;Premedicated before session;Ice applied    Home  Living Family/patient expects to be discharged to:: Unsure(between home and rehab) Living Arrangements: Spouse/significant other(spouse back to work monday) Available Help at Discharge: Family Type of Home: House Home Access: Stairs to enter Entrance Stairs-Rails: Right Home Layout: One level Home Equipment: None      Prior Function Level of Independence: Independent          PT Goals (current goals can now be found in the care plan section) Acute Rehab PT Goals Patient Stated Goal: Regain IND PT Goal Formulation: With patient Time For Goal Achievement: 07/29/18 Potential to Achieve Goals: Good Progress towards PT goals: Progressing toward goals    Frequency    7X/week      PT Plan Current plan remains appropriate    Co-evaluation              AM-PAC PT "6 Clicks" Mobility   Outcome Measure  Help needed turning from your back to your side while in a flat bed without using bedrails?: A Lot Help needed moving from lying on your back to sitting on the side of a flat bed without using bedrails?: A Lot Help needed moving to and from a bed to a chair (including a wheelchair)?: A Lot Help needed standing up  from a chair using your arms (e.g., wheelchair or bedside chair)?: A Lot Help needed to walk in hospital room?: A Lot Help needed climbing 3-5 steps with a railing? : Total 6 Click Score: 11    End of Session   Activity Tolerance: Patient limited by pain Patient left: in bed;with call bell/phone within reach;with family/visitor present Nurse Communication: Mobility status PT Visit Diagnosis: Difficulty in walking, not elsewhere classified (R26.2)     Time: 3785-8850 PT Time Calculation (min) (ACUTE ONLY): 19 min  Charges:  $Therapeutic Exercise: 8-22 mins                     Mauro Kaufmann PT Acute Rehabilitation Services Pager [phone] Office [phone]    [name] 07/23/2018, 12:27 PM

## 2018-07-23 NOTE — Progress Notes (Signed)
MD made aware of pt vitals. No new orders at this time. Will CTM.  Jackelyn Knife, RN

## 2018-07-23 NOTE — Progress Notes (Signed)
Pt stable with no needs. No changes to note. No s/s of distress. Pt continues to be sleepy though arousable.

## 2018-07-24 ENCOUNTER — Encounter (HOSPITAL_COMMUNITY): Payer: Self-pay | Admitting: Orthopaedic Surgery

## 2018-07-24 ENCOUNTER — Inpatient Hospital Stay (HOSPITAL_COMMUNITY): Payer: Commercial Managed Care - PPO

## 2018-07-24 MED ORDER — OXYCODONE HCL 5 MG PO TABS
5.0000 mg | ORAL_TABLET | ORAL | Status: DC | PRN
  Administered 2018-07-24 – 2018-07-26 (×5): 10 mg via ORAL
  Filled 2018-07-24 (×6): qty 2

## 2018-07-24 MED ORDER — METOCLOPRAMIDE HCL 5 MG/ML IJ SOLN
10.0000 mg | Freq: Four times a day (QID) | INTRAMUSCULAR | Status: AC
  Administered 2018-07-24 – 2018-07-25 (×3): 10 mg via INTRAVENOUS
  Filled 2018-07-24 (×3): qty 2

## 2018-07-24 MED ORDER — METOPROLOL TARTRATE 25 MG PO TABS
25.0000 mg | ORAL_TABLET | Freq: Every day | ORAL | Status: DC
  Administered 2018-07-24: 25 mg via ORAL
  Filled 2018-07-24: qty 1

## 2018-07-24 NOTE — Progress Notes (Signed)
Occupational Therapy Treatment Patient Details Name: Troy Bowen MRN: [id number] DOB: 1981-03-30 Today's Date: 07/24/2018    History of present illness Pt s/p Bil THR   OT comments  Pt making progress this day. Pt agreed to get clothes on.  Pt is having stomach discomfort. RN aware  Follow Up Recommendations  Home health OT;Other (comment)(vs pending pt progress and comfort)    Equipment Recommendations  Other (comment)(pending dispo)    Recommendations for Other Services      Precautions / Restrictions Precautions Precautions: Fall Restrictions Weight Bearing Restrictions: No Other Position/Activity Restrictions: WBAT       Mobility Bed Mobility Overal bed mobility: Needs Assistance Bed Mobility: Supine to Sit     Supine to sit: HOB elevated;Supervision     General bed mobility comments: pt used leg lifter to advance BLEs to edge of bed ( issued leg lifter)  Transfers Overall transfer level: Needs assistance Equipment used: Rolling walker (2 wheeled) Transfers: Sit to/from Stand Sit to Stand: From elevated surface;Min guard         General transfer comment: VCs hand placement    Balance   Sitting-balance support: Feet supported;Bilateral upper extremity supported Sitting balance-Leahy Scale: Good     Standing balance support: Bilateral upper extremity supported Standing balance-Leahy Scale: Fair                             ADL either performed or assessed with clinical judgement   ADL Overall ADL's : Needs assistance/impaired                 Upper Body Dressing : Set up;Sitting   Lower Body Dressing: Moderate assistance;Sit to/from stand;Cueing for safety   Toilet Transfer: Moderate assistance;Minimal assistance;RW;Stand-pivot Statistician Details (indicate cue type and reason): bed to chair Toileting- Clothing Manipulation and Hygiene: Moderate assistance;Sit to/from stand;Cueing for safety;Cueing for sequencing          General ADL Comments: pt with good participation this day.      Vision Patient Visual Report: No change from baseline            Cognition Arousal/Alertness: Awake/alert Behavior During Therapy: WFL for tasks assessed/performed Overall Cognitive Status: Within Functional Limits for tasks assessed                                                     Pertinent Vitals/ Pain       Pain Score: 5  Pain Location: bil hips/thighs Pain Descriptors / Indicators: Aching;Burning Pain Intervention(s): Limited activity within patient's tolerance;Monitored during session;Repositioned;Ice applied     Prior Functioning/Environment              Frequency  Min 2X/week        Progress Toward Goals  OT Goals(current goals can now be found in the care plan section)  Progress towards OT goals: Progressing toward goals  Acute Rehab OT Goals Patient Stated Goal: to be able to walk for exercise; return to computer based job  Plan Discharge plan needs to be updated       AM-PAC OT "6 Clicks" Daily Activity     Outcome Measure   Help from another person eating meals?: None Help from another person taking care of personal grooming?: None(sitting) Help from another person toileting, which  includes using toliet, bedpan, or urinal?: A Lot Help from another person bathing (including washing, rinsing, drying)?: A Lot Help from another person to put on and taking off regular upper body clothing?: None Help from another person to put on and taking off regular lower body clothing?: A Lot 6 Click Score: 18    End of Session Equipment Utilized During Treatment: Gait belt;Rolling walker  OT Visit Diagnosis: Other abnormalities of gait and mobility (R26.89);Pain Pain - Right/Left: (bil) Pain - part of body: Hip   Activity Tolerance Patient tolerated treatment well   Patient Left in chair;with call bell/phone within reach;with family/visitor present   Nurse  Communication          Time: 2878-6767 OT Time Calculation (min): 27 min  Charges: OT General Charges $OT Visit: 1 Visit OT Treatments $Self Care/Home Management : 23-37 mins  Lise Auer, OT Acute Rehabilitation Services [phone] Office- [phone]      Henry Demeritt, Karin Golden D 07/24/2018, 3:57 PM

## 2018-07-24 NOTE — Progress Notes (Signed)
Called MD about pt's abdomen looking distended, minimal bowel sounds, and his two bowel movements that was liquid. Order was given.

## 2018-07-24 NOTE — Progress Notes (Signed)
Physical Therapy Treatment Patient Details Name: Troy Bowen MRN: [id number] DOB: 1980-12-02 Today's Date: 07/24/2018    History of Present Illness Pt s/p Bil THR    PT Comments    Encouragement required for pt participation. Pt ambulated 25' with RW, distance limited by B hip and thigh pain. THA exercises performed with min assist. Pt expressed interest in DC to [facility]. Pt recommending home with HHPT. Pt's wife and mother state they can assist pt at home. Pt able to perform bed mobility, transfer, and ambulation without physical assist today. He tolerated increased ambulation distance today as well.    Follow Up Recommendations  Follow surgeon's recommendation for DC plan and follow-up therapies     Equipment Recommendations  Rolling walker with 5" wheels;3in1 (PT)    Recommendations for Other Services OT consult     Precautions / Restrictions Precautions Precautions: Fall Restrictions Weight Bearing Restrictions: No Other Position/Activity Restrictions: WBAT    Mobility  Bed Mobility Overal bed mobility: Needs Assistance Bed Mobility: Supine to Sit     Supine to sit: HOB elevated;Min assist     General bed mobility comments: instructed pt to loop belt around foot to assist with advancing LEs to edge of bed, pt able to come to EOB without physical assist other than placement of belt loop on each foot  Transfers Overall transfer level: Needs assistance Equipment used: Rolling walker (2 wheeled) Transfers: Sit to/from Stand Sit to Stand: +2 safety/equipment;From elevated surface;Min guard         General transfer comment: cues for LE management, use of UEs to self assist  Ambulation/Gait Ambulation/Gait assistance: Min guard Gait Distance (Feet): 25 Feet Assistive device: Rolling walker (2 wheeled) Gait Pattern/deviations: Step-to pattern;Decreased step length - right;Decreased step length - left;Shuffle;Trunk flexed Gait velocity: decr   General Gait  Details: VCs for posture, encouragement to participate   Stairs             Wheelchair Mobility    Modified Rankin (Stroke Patients Only)       Balance   Sitting-balance support: Feet supported;Bilateral upper extremity supported Sitting balance-Leahy Scale: Good     Standing balance support: Bilateral upper extremity supported Standing balance-Leahy Scale: Fair                              Cognition Arousal/Alertness: Awake/alert Behavior During Therapy: WFL for tasks assessed/performed Overall Cognitive Status: Within Functional Limits for tasks assessed                                        Exercises Total Joint Exercises Ankle Circles/Pumps: AROM;Both;15 reps;Supine Quad Sets: AROM;Both;10 reps;Supine Heel Slides: AAROM;Both;15 reps;Supine Hip ABduction/ADduction: AAROM;Both;10 reps;Supine Long Arc Quad: AROM;Both;10 reps;Seated    General Comments        Pertinent Vitals/Pain Pain Score: 6  Pain Location: bil hips/thighs Pain Descriptors / Indicators: Aching;Burning Pain Intervention(s): Limited activity within patient's tolerance;Monitored during session;Premedicated before session;Ice applied    Home Living                      Prior Function            PT Goals (current goals can now be found in the care plan section) Acute Rehab PT Goals Patient Stated Goal: to be able to walk for exercise PT Goal Formulation:  With patient/family Time For Goal Achievement: 07/29/18 Potential to Achieve Goals: Good Progress towards PT goals: Progressing toward goals    Frequency    7X/week      PT Plan Current plan remains appropriate    Co-evaluation              AM-PAC PT "6 Clicks" Mobility   Outcome Measure  Help needed turning from your back to your side while in a flat bed without using bedrails?: A Little Help needed moving from lying on your back to sitting on the side of a flat bed without  using bedrails?: A Little Help needed moving to and from a bed to a chair (including a wheelchair)?: A Little Help needed standing up from a chair using your arms (e.g., wheelchair or bedside chair)?: A Little Help needed to walk in hospital room?: A Little Help needed climbing 3-5 steps with a railing? : A Lot 6 Click Score: 17    End of Session Equipment Utilized During Treatment: Gait belt Activity Tolerance: Patient tolerated treatment well;Patient limited by fatigue;Patient limited by pain Patient left: with call bell/phone within reach;in chair;with family/visitor present Nurse Communication: Mobility status PT Visit Diagnosis: Difficulty in walking, not elsewhere classified (R26.2)     Time: 1062-6948 PT Time Calculation (min) (ACUTE ONLY): 26 min  Charges:  $Gait Training: 8-22 mins $Therapeutic Exercise: 8-22 mins                     Ralene Bathe Kistler PT 07/24/2018  Acute Rehabilitation Services Pager [phone] Office [phone]

## 2018-07-24 NOTE — Progress Notes (Signed)
Physical Therapy Treatment Patient Details Name: Troy Bowen MRN: [id number] DOB: August 20, 1980 Today's Date: 07/24/2018    History of Present Illness Pt s/p Bil THR    PT Comments    Pt tolerated increased ambulation distance of 16' with RW. Performed THA exercises with supervision using leg lifter to assist as needed. Pt noting abdominal pain, lack of flatus over past couple days, belly feeling firm, RN notified. Encouraged mobility.    Follow Up Recommendations  Follow surgeon's recommendation for DC plan and follow-up therapies     Equipment Recommendations  Rolling walker with 5" wheels;3in1 (PT)    Recommendations for Other Services OT consult     Precautions / Restrictions Precautions Precautions: Fall Restrictions Weight Bearing Restrictions: No Other Position/Activity Restrictions: WBAT    Mobility  Bed Mobility Overal bed mobility: Needs Assistance Bed Mobility: Supine to Sit     Supine to sit: HOB elevated;Supervision     General bed mobility comments: pt used leg lifter to advance BLEs to edge of bed  Transfers Overall transfer level: Needs assistance Equipment used: Rolling walker (2 wheeled) Transfers: Sit to/from Stand Sit to Stand: From elevated surface;Min guard         General transfer comment: VCs hand placement  Ambulation/Gait Ambulation/Gait assistance: Min guard Gait Distance (Feet): 50 Feet Assistive device: Rolling walker (2 wheeled) Gait Pattern/deviations: Step-to pattern;Decreased step length - right;Decreased step length - left;Shuffle;Trunk flexed Gait velocity: decr   General Gait Details: VCs for posture, distance limited by pain/fatigue   Stairs             Wheelchair Mobility    Modified Rankin (Stroke Patients Only)       Balance   Sitting-balance support: Feet supported;Bilateral upper extremity supported Sitting balance-Leahy Scale: Good     Standing balance support: Bilateral upper extremity  supported Standing balance-Leahy Scale: Fair                              Cognition Arousal/Alertness: Awake/alert Behavior During Therapy: WFL for tasks assessed/performed Overall Cognitive Status: Within Functional Limits for tasks assessed                                        Exercises Total Joint Exercises Ankle Circles/Pumps: AROM;Both;15 reps;Supine Quad Sets: AROM;Both;10 reps;Supine Short Arc Quad: AROM;Both;15 reps;Supine Heel Slides: AAROM;Both;15 reps;Supine Hip ABduction/ADduction: AAROM;Both;Supine;15 reps Long Arc Quad: AROM;Both;10 reps;Seated    General Comments        Pertinent Vitals/Pain Pain Score: 6  Pain Location: bil hips/thighs Pain Descriptors / Indicators: Aching;Burning Pain Intervention(s): Limited activity within patient's tolerance;Monitored during session;Premedicated before session;Ice applied    Home Living                      Prior Function            PT Goals (current goals can now be found in the care plan section) Acute Rehab PT Goals Patient Stated Goal: to be able to walk for exercise; return to computer based job PT Goal Formulation: With patient/family Time For Goal Achievement: 07/29/18 Potential to Achieve Goals: Good Progress towards PT goals: Progressing toward goals    Frequency    7X/week      PT Plan Current plan remains appropriate    Co-evaluation  AM-PAC PT "6 Clicks" Mobility   Outcome Measure  Help needed turning from your back to your side while in a flat bed without using bedrails?: A Little Help needed moving from lying on your back to sitting on the side of a flat bed without using bedrails?: A Little Help needed moving to and from a bed to a chair (including a wheelchair)?: A Little Help needed standing up from a chair using your arms (e.g., wheelchair or bedside chair)?: A Little Help needed to walk in hospital room?: A Little Help needed  climbing 3-5 steps with a railing? : A Lot 6 Click Score: 17    End of Session Equipment Utilized During Treatment: Gait belt Activity Tolerance: Patient tolerated treatment well;Patient limited by fatigue;Patient limited by pain Patient left: with call bell/phone within reach;in chair;with family/visitor present Nurse Communication: Mobility status PT Visit Diagnosis: Difficulty in walking, not elsewhere classified (R26.2)     Time: 1610-9604 PT Time Calculation (min) (ACUTE ONLY): 33 min  Charges:  $Gait Training: 8-22 mins $Therapeutic Exercise: 8-22 mins                     Ralene Bathe Kistler PT 07/24/2018  Acute Rehabilitation Services Pager [phone] Office [phone]

## 2018-07-24 NOTE — Progress Notes (Signed)
Subjective: 3 Days Post-Op Procedure(s) (LRB): BILATERAL ANTERIOR TOTAL HIP ARTHROPLASTY (Bilateral) Patient reports pain as moderate.   Has some indigestion.  Making progress with therapy and mobility.  Objective: Vital signs in last 24 hours: Temp:  [98.1 F (36.7 C)-98.2 F (36.8 C)] 98.1 F (36.7 C) (03/09 0609) Pulse Rate:  [88-98] 88 (03/09 0609) Resp:  [17] 17 (03/08 2117) BP: (121-142)/(81-98) 121/81 (03/09 0609) SpO2:  [100 %] 100 % (03/09 0609)  Intake/Output from previous day: 03/08 0701 - 03/09 0700 In: 1623.3 [P.O.:1040; I.V.:583.3] Out: 850 [Urine:850] Intake/Output this shift: No intake/output data recorded.  Recent Labs    07/22/18 0342  HGB 11.0*   Recent Labs    07/22/18 0342  WBC 7.5  RBC 3.81*  HCT 35.7*  PLT 213   Recent Labs    07/22/18 0342  NA 134*  K 4.0  CL 101  CO2 25  BUN 14  CREATININE 0.97  GLUCOSE 155*  CALCIUM 8.4*   No results for input(s): LABPT, INR in the last 72 hours.  Sensation intact distally Intact pulses distally Dorsiflexion/Plantar flexion intact Incision: scant drainage   Assessment/Plan: 3 Days Post-Op Procedure(s) (LRB): BILATERAL ANTERIOR TOTAL HIP ARTHROPLASTY (Bilateral) Up with therapy Plan for discharge tomorrow Discharge home with home health      Kathryne Hitch 07/24/2018, 7:12 AM

## 2018-07-25 MED ORDER — METOCLOPRAMIDE HCL 5 MG/ML IJ SOLN
10.0000 mg | Freq: Once | INTRAMUSCULAR | Status: AC
  Administered 2018-07-25: 10 mg via INTRAVENOUS
  Filled 2018-07-25: qty 2

## 2018-07-25 MED ORDER — FLEET ENEMA 7-19 GM/118ML RE ENEM
1.0000 | ENEMA | Freq: Once | RECTAL | Status: AC
  Administered 2018-07-25: 1 via RECTAL
  Filled 2018-07-25: qty 1

## 2018-07-25 NOTE — Progress Notes (Signed)
Patient ID: Troy Bowen, male   DOB: 04/16/1981, 38 y.o.   MRN: [id number] Has developed a post-op ileus.  Did get reglan IV and has limited his narcotic intake.  Is sitting in a chair this morning and reports feeling less distended.  His abdomen is soft and slightly less distended than yesterday.  His vitals are stable.  Will encourage more mobility today.  Hopefully will continue to improve so can be discharged to home possibly tomorrow.  Denies nausea.

## 2018-07-25 NOTE — Progress Notes (Signed)
Occupational Therapy Treatment Patient Details Name: Troy Bowen MRN: [id number] DOB: 1980/10/10 Today's Date: 07/25/2018    History of present illness Pt s/p Bil THR   OT comments  Pt moving MUCH better- although has a post op ileus.  Reiterated importance of activity  Follow Up Recommendations  Home health OT;Other (comment)(vs pending pt progress and comfort)    Equipment Recommendations  Other (comment)(pending dispo)    Recommendations for Other Services      Precautions / Restrictions Precautions Precautions: Fall Restrictions Weight Bearing Restrictions: No       Mobility Bed Mobility               General bed mobility comments: up in recliner upon arrival  Transfers Overall transfer level: Needs assistance Equipment used: Rolling walker (2 wheeled) Transfers: Sit to/from [employer] Sit to Stand: Min guard Stand pivot transfers: Min guard       General transfer comment: cues with stand > sit for hand and LE placement    Balance           Standing balance support: Bilateral upper extremity supported Standing balance-Leahy Scale: Fair                             ADL either performed or assessed with clinical judgement   ADL Overall ADL's : Needs assistance/impaired     Grooming: Standing;Min guard                   Toilet Transfer: RW;Ambulation;Comfort height toilet;Cueing for safety;Min guard;Cueing for sequencing   Toileting- Clothing Manipulation and Hygiene: Minimal assistance;Sit to/from stand;Cueing for safety;Cueing for sequencing       Functional mobility during ADLs: Minimal assistance;Rolling walker       Vision Baseline Vision/History: No visual deficits Wears Glasses: At all times Patient Visual Report: No change from baseline            Cognition Arousal/Alertness: Awake/alert Behavior During Therapy: WFL for tasks assessed/performed Overall Cognitive Status: Within  Functional Limits for tasks assessed                                                     Pertinent Vitals/ Pain       Pain Assessment: 0-10 Pain Score: 2  Pain Location: bil hips/thighs Pain Descriptors / Indicators: Sore Pain Intervention(s): Limited activity within patient's tolerance;Monitored during session;Repositioned         Frequency  Min 2X/week        Progress Toward Goals  OT Goals(current goals can now be found in the care plan section)  Progress towards OT goals: Progressing toward goals     Plan Discharge plan needs to be updated    Co-evaluation                 AM-PAC OT "6 Clicks" Daily Activity     Outcome Measure   Help from another person eating meals?: None Help from another person taking care of personal grooming?: None(sitting) Help from another person toileting, which includes using toliet, bedpan, or urinal?: A Little Help from another person bathing (including washing, rinsing, drying)?: A Little Help from another person to put on and taking off regular upper body clothing?: None Help from another person to put on and taking  off regular lower body clothing?: A Lot 6 Click Score: 20    End of Session Equipment Utilized During Treatment: Gait belt;Rolling walker  OT Visit Diagnosis: Other abnormalities of gait and mobility (R26.89);Pain Pain - Right/Left: (bil) Pain - part of body: Hip   Activity Tolerance Patient tolerated treatment well   Patient Left in chair;with call bell/phone within reach;with family/visitor present   Nurse Communication          Time: 1018-1030 OT Time Calculation (min): 12 min  Charges: OT General Charges $OT Visit: 1 Visit OT Treatments $Self Care/Home Management : 8-22 mins  Lise Auer, OT Acute Rehabilitation Services [phone] Office- [phone]      Kendrell Lottman, Karin Golden D 07/25/2018, 11:43 AM

## 2018-07-25 NOTE — Progress Notes (Signed)
Physical Therapy Treatment Patient Details Name: Troy Bowen MRN: [id number] DOB: 15-Feb-1981 Today's Date: 07/25/2018    History of Present Illness Pt s/p Bil THR    PT Comments    Pt did well with stair training using sideways technique.  He did c/o gas pains after gait, but was unable to pass any at that time.     Follow Up Recommendations  Follow surgeon's recommendation for DC plan and follow-up therapies     Equipment Recommendations  Rolling walker with 5" wheels;3in1 (PT)    Recommendations for Other Services       Precautions / Restrictions Precautions Precautions: Fall Restrictions Weight Bearing Restrictions: No    Mobility  Bed Mobility               General bed mobility comments: up in recliner upon arrival  Transfers Overall transfer level: Needs assistance Equipment used: Rolling walker (2 wheeled) Transfers: Sit to/from Stand Sit to Stand: Min guard         General transfer comment: cues with stand > sit for hand and LE placement  Ambulation/Gait Ambulation/Gait assistance: Min guard Gait Distance (Feet): 100 Feet Assistive device: Rolling walker (2 wheeled) Gait Pattern/deviations: Step-to pattern;Decreased step length - right;Decreased step length - left;Trunk flexed Gait velocity: decr   General Gait Details: Posture more flexed as gait progressed   Stairs Stairs: Yes Stairs assistance: Min guard Stair Management: One rail Left;Sideways;Step to pattern Number of Stairs: 3 General stair comments: Pt did well with stairs and felt confident in negotiating at home.   Wheelchair Mobility    Modified Rankin (Stroke Patients Only)       Balance           Standing balance support: Bilateral upper extremity supported Standing balance-Leahy Scale: Fair                              Cognition Arousal/Alertness: Awake/alert Behavior During Therapy: WFL for tasks assessed/performed Overall Cognitive Status:  Within Functional Limits for tasks assessed                                        Exercises      General Comments        Pertinent Vitals/Pain Pain Assessment: 0-10 Pain Score: 3  Pain Location: bil hips/thighs Pain Descriptors / Indicators: (incisional) Pain Intervention(s): Limited activity within patient's tolerance;Monitored during session;Repositioned;Ice applied    Home Living                      Prior Function            PT Goals (current goals can now be found in the care plan section) Acute Rehab PT Goals PT Goal Formulation: With patient/family Time For Goal Achievement: 07/29/18 Potential to Achieve Goals: Good Progress towards PT goals: Progressing toward goals    Frequency    7X/week      PT Plan Current plan remains appropriate    Co-evaluation              AM-PAC PT "6 Clicks" Mobility   Outcome Measure  Help needed turning from your back to your side while in a flat bed without using bedrails?: A Little Help needed moving from lying on your back to sitting on the side of a flat bed without using  bedrails?: A Little Help needed moving to and from a bed to a chair (including a wheelchair)?: A Little Help needed standing up from a chair using your arms (e.g., wheelchair or bedside chair)?: A Little Help needed to walk in hospital room?: A Little Help needed climbing 3-5 steps with a railing? : A Little 6 Click Score: 18    End of Session Equipment Utilized During Treatment: Gait belt Activity Tolerance: Patient tolerated treatment well Patient left: in chair;with call bell/phone within reach;with family/visitor present   PT Visit Diagnosis: Difficulty in walking, not elsewhere classified (R26.2)     Time: 5277-8242 PT Time Calculation (min) (ACUTE ONLY): 13 min  Charges:  $Gait Training: 8-22 mins                     Troy Bowen L. Katrinka Blazing, [place] Pager [phone] 07/25/2018    Troy Bowen 07/25/2018, 11:01  AM

## 2018-07-25 NOTE — Progress Notes (Signed)
Physical Therapy Treatment Patient Details Name: Troy Bowen MRN: [id number] DOB: October 27, 1980 Today's Date: 07/25/2018    History of Present Illness Pt s/p Bil THR, now with ileus.    PT Comments    Pt able to increase ambulation distance.  He continues to have incisional pain and some abdominal pain.  Recommend HHPT.   Follow Up Recommendations  Follow surgeon's recommendation for DC plan and follow-up therapies;Home health PT     Equipment Recommendations  Rolling walker with 5" wheels;3in1 (PT)    Recommendations for Other Services       Precautions / Restrictions Precautions Precautions: Fall Restrictions Weight Bearing Restrictions: No    Mobility  Bed Mobility               General bed mobility comments: up in recliner upon arrival  Transfers Overall transfer level: Needs assistance Equipment used: Rolling walker (2 wheeled) Transfers: Sit to/from Stand Sit to Stand: Min guard Stand pivot transfers: Min guard       General transfer comment: cues with stand > sit for hand and LE placement  Ambulation/Gait Ambulation/Gait assistance: Min guard Gait Distance (Feet): 180 Feet Assistive device: Rolling walker (2 wheeled) Gait Pattern/deviations: Step-to pattern;Decreased step length - right;Decreased step length - left;Trunk flexed Gait velocity: decr   General Gait Details: Posture more flexed as gait progressed   Stairs             Wheelchair Mobility    Modified Rankin (Stroke Patients Only)       Balance           Standing balance support: Bilateral upper extremity supported Standing balance-Leahy Scale: Fair                              Cognition Arousal/Alertness: Awake/alert Behavior During Therapy: WFL for tasks assessed/performed Overall Cognitive Status: Within Functional Limits for tasks assessed                                        Exercises      General Comments         Pertinent Vitals/Pain Pain Score: 3  Pain Location: hips Pain Descriptors / Indicators: Sore Pain Intervention(s): Limited activity within patient's tolerance;Monitored during session;Repositioned;Ice applied    Home Living                      Prior Function            PT Goals (current goals can now be found in the care plan section) Acute Rehab PT Goals PT Goal Formulation: With patient/family Time For Goal Achievement: 07/29/18 Potential to Achieve Goals: Good Progress towards PT goals: Progressing toward goals    Frequency    7X/week      PT Plan Current plan remains appropriate    Co-evaluation              AM-PAC PT "6 Clicks" Mobility   Outcome Measure  Help needed turning from your back to your side while in a flat bed without using bedrails?: A Little Help needed moving from lying on your back to sitting on the side of a flat bed without using bedrails?: A Little Help needed moving to and from a bed to a chair (including a wheelchair)?: A Little Help needed standing up from a chair  using your arms (e.g., wheelchair or bedside chair)?: A Little Help needed to walk in hospital room?: A Little Help needed climbing 3-5 steps with a railing? : A Little 6 Click Score: 18    End of Session Equipment Utilized During Treatment: Gait belt Activity Tolerance: Patient tolerated treatment well Patient left: in chair;with call bell/phone within reach;with family/visitor present Nurse Communication: Mobility status PT Visit Diagnosis: Difficulty in walking, not elsewhere classified (R26.2)     Time: 1340-1355 PT Time Calculation (min) (ACUTE ONLY): 15 min  Charges:  $Gait Training: 8-22 mins                     Britney Captain L. Katrinka Blazing, [place] Pager [phone] 07/25/2018    Enzo Montgomery 07/25/2018, 2:15 PM

## 2018-07-26 MED ORDER — METHOCARBAMOL 500 MG PO TABS: 500.0000 mg | ORAL_TABLET | Freq: Four times a day (QID) | ORAL | 0 refills | Status: AC | PRN

## 2018-07-26 MED ORDER — DOCUSATE SODIUM 100 MG PO CAPS: 100.0000 mg | ORAL_CAPSULE | Freq: Two times a day (BID) | ORAL | 1 refills | Status: AC

## 2018-07-26 MED ORDER — OXYCODONE HCL 5 MG PO TABS: 5.0000 mg | ORAL_TABLET | ORAL | 0 refills | Status: AC | PRN

## 2018-07-26 MED ORDER — ASPIRIN 81 MG PO CHEW: 81.0000 mg | CHEWABLE_TABLET | Freq: Two times a day (BID) | ORAL | 0 refills | Status: AC

## 2018-07-26 NOTE — Progress Notes (Signed)
PT Cancellation Note  Patient Details Name: Troy Bowen MRN: [id number] DOB: 1980/07/08   Cancelled Treatment:    Reason Eval/Treat Not Completed: Other (comment)(Pt reports he's been ambulating in the halls independently, and is independent with HEP. Reviewed HEP and encouraged frequent ambulation. PT goals met, will sign off. )  Philomena Doheny PT 07/26/2018  Acute Rehabilitation Services Pager [phone] Office [phone]

## 2018-07-26 NOTE — Progress Notes (Signed)
Patient ID: Troy Bowen, male   DOB: 1980/08/21, 38 y.o.   MRN: [id number] Feels better overall and continues to look better.  Can be discharged to home this afternoon.

## 2018-07-26 NOTE — Progress Notes (Signed)
Occupational Therapy Treatment Patient Details Name: Troy Bowen MRN: [id number] DOB: March 05, 1981 Today's Date: 07/26/2018    History of present illness Pt s/p Bil THR, now with ileus.   OT comments  Pt plans to DC home this day  Follow Up Recommendations  Home health OT;Other (comment)(vs pending pt progress and comfort)    Equipment Recommendations  Other (comment)(pending dispo)    Recommendations for Other Services      Precautions / Restrictions Precautions Precautions: Fall       Mobility Bed Mobility Overal bed mobility: Modified Independent             General bed mobility comments: leg lifter  Transfers Overall transfer level: Needs assistance Equipment used: Rolling walker (2 wheeled) Transfers: Sit to/from [employer] Sit to Stand: Supervision Stand pivot transfers: Supervision       General transfer comment: cues for UE placement    Balance Overall balance assessment: Needs assistance Sitting-balance support: No upper extremity supported Sitting balance-Leahy Scale: Normal     Standing balance support: Bilateral upper extremity supported Standing balance-Leahy Scale: Good                             ADL either performed or assessed with clinical judgement   ADL Overall ADL's : Needs assistance/impaired     Grooming: Wash/dry hands;Standing       Lower Body Bathing: Sit to/from stand;Sitting/lateral leans;Cueing for compensatory techniques;With adaptive equipment;Minimal assistance;Cueing for safety;Cueing for sequencing       Lower Body Dressing: Sit to/from stand;Cueing for safety;Minimal assistance;Cueing for sequencing;With adaptive equipment   Toilet Transfer: RW;Ambulation;Comfort height toilet;Cueing for safety;Cueing for sequencing;Supervision/safety   Toileting- Clothing Manipulation and Hygiene: Sit to/from stand;Cueing for safety;Cueing for sequencing;Supervision/safety     Tub/Shower  Transfer Details (indicate cue type and reason): verbalized safety-  may obtain a tub bench Functional mobility during ADLs: Supervision/safety;Rolling walker General ADL Comments: pt with good participation this day.      Vision Patient Visual Report: No change from baseline            Cognition Arousal/Alertness: Awake/alert Behavior During Therapy: WFL for tasks assessed/performed Overall Cognitive Status: Within Functional Limits for tasks assessed                                                     Pertinent Vitals/ Pain       Pain Score: 2  Pain Location: hips Pain Descriptors / Indicators: Sore Pain Intervention(s): Limited activity within patient's tolerance;Repositioned         Frequency  Min 2X/week        Progress Toward Goals  OT Goals(current goals can now be found in the care plan section)  Progress towards OT goals: Progressing toward goals     Plan Discharge plan remains appropriate       AM-PAC OT "6 Clicks" Daily Activity     Outcome Measure   Help from another person eating meals?: None Help from another person taking care of personal grooming?: None(sitting) Help from another person toileting, which includes using toliet, bedpan, or urinal?: A Little Help from another person bathing (including washing, rinsing, drying)?: A Little Help from another person to put on and taking off regular upper body clothing?: None Help from another person to  put on and taking off regular lower body clothing?: A Little 6 Click Score: 21    End of Session Equipment Utilized During Treatment: Gait belt;Rolling walker  OT Visit Diagnosis: Other abnormalities of gait and mobility (R26.89);Pain Pain - Right/Left: (bil) Pain - part of body: Hip   Activity Tolerance Patient tolerated treatment well   Patient Left in chair;with call bell/phone within reach;with family/visitor present   Nurse Communication          Time: 0086-7619 OT  Time Calculation (min): 23 min  Charges: OT General Charges $OT Visit: 1 Visit OT Treatments $Self Care/Home Management : 23-37 mins  Lise Auer, OT Acute Rehabilitation Services [phone] Office- [phone]      Addaleigh Nicholls, Karin Golden D 07/26/2018, 1:27 PM

## 2018-07-26 NOTE — Progress Notes (Signed)
Patient ID: Troy Bowen, male   DOB: 06/27/1980, 38 y.o.   MRN: [id number] Looks much better today and feels better.  Bil hips stable.  Vitals stable.  Possibly discharge to home this afternoon.

## 2018-07-26 NOTE — Discharge Summary (Signed)
Patient ID: Troy Bowen MRN: [id number] DOB/AGE: May 18, 1980 37 y.o.  Admit date: 07/21/2018 Discharge date: 07/26/2018  Admission Diagnoses:  Principal Problem:   Avascular necrosis of bones of both hips [facility]) Active Problems:   Status post bilateral total hip replacement   Discharge Diagnoses:  Same  Past Medical History:  Diagnosis Date  . Anxiety   . Depression   . GERD (gastroesophageal reflux disease)     Surgeries: Procedure(s): BILATERAL ANTERIOR TOTAL HIP ARTHROPLASTY on 07/21/2018   Consultants:   Discharged Condition: Improved  Hospital Course: Troy Bowen is an 38 y.o. male who was admitted 07/21/2018 for operative treatment ofAvascular necrosis of bones of both hips (HCC). Patient has severe unremitting pain that affects sleep, daily activities, and work/hobbies. After pre-op clearance the patient was taken to the operating room on 07/21/2018 and underwent  Procedure(s): BILATERAL ANTERIOR TOTAL HIP ARTHROPLASTY.    Patient was given perioperative antibiotics:  Anti-infectives (From admission, onward)   Start     Dose/Rate Route Frequency Ordered Stop   07/21/18 2000  ceFAZolin (ANCEF) IVPB 1 g/50 mL premix     1 g 100 mL/hr over 30 Minutes Intravenous Every 6 hours 07/21/18 1817 07/22/18 0232   07/21/18 1030  ceFAZolin (ANCEF) IVPB 2g/100 mL premix     2 g 200 mL/hr over 30 Minutes Intravenous On call to O.R. 07/21/18 1016 07/21/18 1328       Patient was given sequential compression devices, early ambulation, and chemoprophylaxis to prevent DVT.  Patient benefited maximally from hospital stay.  He did have a post-op ileus following surgery that did improve upon discharge.  It did not require a NG tube, but just backing down on pain meds, stool softeners, mobility and reglan.  Recent vital signs:  Patient Vitals for the past 24 hrs:  BP Temp Temp src Pulse Resp SpO2  07/26/18 0540 (!) 141/88 97.7 F (36.5 C) Oral (!) 102 18 98 %  07/25/18 2058  - 98.4 F (36.9 C) Oral - - -  07/25/18 2055 128/85 100.3 F (37.9 C) Oral (!) 110 18 99 %  07/25/18 1930 - 98.6 F (37 C) Oral - - -  07/25/18 1406 (!) 141/94 98.3 F (36.8 C) Oral (!) 117 18 97 %     Recent laboratory studies: No results for input(s): WBC, HGB, HCT, PLT, NA, K, CL, CO2, BUN, CREATININE, GLUCOSE, INR, CALCIUM in the last 72 hours.  Invalid input(s): PT, 2   Discharge Medications:   Allergies as of 07/26/2018   No Known Allergies     Medication List    STOP taking these medications   diclofenac 75 MG EC tablet Commonly known as:  VOLTAREN     TAKE these medications   aspirin 81 MG chewable tablet Chew 1 tablet (81 mg total) by mouth 2 (two) times daily.   docusate sodium 100 MG capsule Commonly known as:  Colace Take 1 capsule (100 mg total) by mouth 2 (two) times daily.   escitalopram 20 MG tablet Commonly known as:  LEXAPRO Take 20 mg by mouth daily.   methocarbamol 500 MG tablet Commonly known as:  ROBAXIN Take 1 tablet (500 mg total) by mouth every 6 (six) hours as needed for muscle spasms.   omeprazole 20 MG capsule Commonly known as:  PRILOSEC Take 20 mg by mouth daily.   oxyCODONE 5 MG immediate release tablet Commonly known as:  Oxy IR/ROXICODONE Take 1-2 tablets (5-10 mg total) by mouth every 4 (four) hours  as needed for moderate pain (pain score 4-6).   venlafaxine XR 75 MG 24 hr capsule Commonly known as:  EFFEXOR-XR Take 75 mg by mouth daily with breakfast.            Durable Medical Equipment  (From admission, onward)         Start     Ordered   07/22/18 1021  For home use only DME Walker rolling  Once    Question:  Patient needs a walker to treat with the following condition  Answer:  S/P hip replacement, bilateral   07/22/18 1021   07/22/18 1021  For home use only DME Bedside commode  Once    Question:  Patient needs a bedside commode to treat with the following condition  Answer:  S/P hip replacement, bilateral    07/22/18 1021   07/21/18 1637  DME 3 n 1  Once     07/21/18 1636   07/21/18 1637  DME Walker rolling  Once    Question:  Patient needs a walker to treat with the following condition  Answer:  Status post bilateral total hip replacement   07/21/18 1636          Diagnostic Studies: Dg Abd 1 View  Result Date: 07/24/2018 CLINICAL DATA:  Bilateral hip replacement 3 days ago. Evaluate for postop ileus. EXAM: ABDOMEN - 1 VIEW COMPARISON:  None. FINDINGS: Gaseous distension of large and small bowel consistent with postop ileus. IMPRESSION: Postop ileus. Electronically Signed   By: Gerome Sam III M.D   On: 07/24/2018 18:12   Dg Pelvis Portable  Result Date: 07/21/2018 CLINICAL DATA:  Status post bilateral total hip arthroplasty EXAM: PORTABLE PELVIS 1-2 VIEWS COMPARISON:  06/22/2018 pelvic radiograph FINDINGS: Interval bilateral total hip arthroplasty with well-positioned bilateral acetabular and bilateral proximal femoral prostheses. No evidence of hip dislocation. No acute osseous fracture. No focal osseous lesions. Expected soft tissue gas surrounding the hips bilaterally with lateral skin staples in both hips. IMPRESSION: Satisfactory immediate postoperative appearance status post bilateral total hip arthroplasty, with no evidence of hip dislocation on this single frontal view. Electronically Signed   By: Delbert Phenix M.D.   On: 07/21/2018 17:35   Dg C-arm 1-60 Min  Result Date: 07/21/2018 CLINICAL DATA:  Bilateral hip replacements. EXAM: DG C-ARM 61-120 MIN; OPERATIVE RIGHT HIP WITH PELVIS; OPERATIVE LEFT HIP WITH PELVIS COMPARISON:  None. FLUOROSCOPY TIME:  Fluoroscopy Time:  24 seconds Radiation Exposure Index (if provided by the fluoroscopic device): 3.75 mGy Number of Acquired Spot Images: 0 FINDINGS: Intraoperative fluoro spot images demonstrate a right total hip arthroplasty in place. No fractures are evident. Subsequent left total hip arthroplasty is documented. No associated fractures are  present. IMPRESSION: 1. Status post bilateral total hip arthroplasties without radiographic evidence for complication on AP fluoro spot images. Electronically Signed   By: Marin Roberts M.D.   On: 07/21/2018 18:16   Dg C-arm 1-60 Min  Result Date: 07/21/2018 CLINICAL DATA:  Bilateral hip replacements. EXAM: DG C-ARM 61-120 MIN; OPERATIVE RIGHT HIP WITH PELVIS; OPERATIVE LEFT HIP WITH PELVIS COMPARISON:  None. FLUOROSCOPY TIME:  Fluoroscopy Time:  24 seconds Radiation Exposure Index (if provided by the fluoroscopic device): 3.75 mGy Number of Acquired Spot Images: 0 FINDINGS: Intraoperative fluoro spot images demonstrate a right total hip arthroplasty in place. No fractures are evident. Subsequent left total hip arthroplasty is documented. No associated fractures are present. IMPRESSION: 1. Status post bilateral total hip arthroplasties without radiographic evidence for complication on AP  fluoro spot images. Electronically Signed   By: Marin Roberts M.D.   On: 07/21/2018 18:16   Dg Hip Operative Unilat W Or W/o Pelvis Left  Result Date: 07/21/2018 CLINICAL DATA:  Bilateral hip replacements. EXAM: DG C-ARM 61-120 MIN; OPERATIVE RIGHT HIP WITH PELVIS; OPERATIVE LEFT HIP WITH PELVIS COMPARISON:  None. FLUOROSCOPY TIME:  Fluoroscopy Time:  24 seconds Radiation Exposure Index (if provided by the fluoroscopic device): 3.75 mGy Number of Acquired Spot Images: 0 FINDINGS: Intraoperative fluoro spot images demonstrate a right total hip arthroplasty in place. No fractures are evident. Subsequent left total hip arthroplasty is documented. No associated fractures are present. IMPRESSION: 1. Status post bilateral total hip arthroplasties without radiographic evidence for complication on AP fluoro spot images. Electronically Signed   By: Marin Roberts M.D.   On: 07/21/2018 18:16   Dg Hip Operative Unilat W Or W/o Pelvis Right  Result Date: 07/21/2018 CLINICAL DATA:  Bilateral hip replacements. EXAM:  DG C-ARM 61-120 MIN; OPERATIVE RIGHT HIP WITH PELVIS; OPERATIVE LEFT HIP WITH PELVIS COMPARISON:  None. FLUOROSCOPY TIME:  Fluoroscopy Time:  24 seconds Radiation Exposure Index (if provided by the fluoroscopic device): 3.75 mGy Number of Acquired Spot Images: 0 FINDINGS: Intraoperative fluoro spot images demonstrate a right total hip arthroplasty in place. No fractures are evident. Subsequent left total hip arthroplasty is documented. No associated fractures are present. IMPRESSION: 1. Status post bilateral total hip arthroplasties without radiographic evidence for complication on AP fluoro spot images. Electronically Signed   By: Marin Roberts M.D.   On: 07/21/2018 18:16    Disposition: Discharge disposition: 01-Home or Self Care         Follow-up Information    Kathryne Hitch, MD Follow up in 2 week(s).   Specialty:  Orthopedic Surgery Contact information: [address] [place] [address] [phone]        [facility] Follow up.   Specialty:  Home Health Services Why:  Home Health Physical Therapy-agency will call to arrange initial visit Contact information: [address] [place] [address] [phone]            Signed: Kathryne Hitch 07/26/2018, 11:37 AM

## 2018-07-26 NOTE — Progress Notes (Signed)
Discharge instructions discussed with patient and family, verbalized agreement and understanding 

## 2018-07-27 ENCOUNTER — Telehealth (INDEPENDENT_AMBULATORY_CARE_PROVIDER_SITE_OTHER): Payer: Self-pay

## 2018-07-27 NOTE — Telephone Encounter (Signed)
[facility] called and would like to get verbal orders for patient 1 week 1, 2 week 2 and then 1 week 1. Approved orders. FYI  CB [phone]

## 2018-07-29 ENCOUNTER — Encounter (INDEPENDENT_AMBULATORY_CARE_PROVIDER_SITE_OTHER): Payer: Self-pay | Admitting: Orthopaedic Surgery

## 2018-08-07 ENCOUNTER — Other Ambulatory Visit: Payer: Self-pay

## 2018-08-07 ENCOUNTER — Ambulatory Visit (INDEPENDENT_AMBULATORY_CARE_PROVIDER_SITE_OTHER): Payer: Commercial Managed Care - PPO | Admitting: Orthopaedic Surgery

## 2018-08-07 ENCOUNTER — Encounter (INDEPENDENT_AMBULATORY_CARE_PROVIDER_SITE_OTHER): Payer: Self-pay | Admitting: Orthopaedic Surgery

## 2018-08-07 ENCOUNTER — Inpatient Hospital Stay (INDEPENDENT_AMBULATORY_CARE_PROVIDER_SITE_OTHER): Payer: Commercial Managed Care - PPO | Admitting: Orthopaedic Surgery

## 2018-08-07 DIAGNOSIS — Z96643 Presence of artificial hip joint, bilateral: Secondary | ICD-10-CM

## 2018-08-07 NOTE — Progress Notes (Signed)
HPI: Troy Bowen returns now 2 weeks status post bilateral total hip arthroplasties.  He is overall doing well he is ambulating with a walker.  Has had no chest pain shortness of breath fevers chills.  Has been on aspirin 81 mg twice daily and is been taking it mostly once daily.  Taking one oxycodone at night.  Physical exam: General well-developed well-nourished male in no acute distress.  Bilateral hips good range of motion.  Surgical incisions healing well well approximated with staples no signs of infection.  Calf supple nontender.  Dorsiflexion plantarflexion ankles intact.  Impression: 2-week status post bilateral total hip arthroplasties  Plan: He will continue with aspirin 81 mg once daily for another week and then discontinue as he was on no aspirin prior to surgery.  Staples were removed today Steri-Strips applied he is able to get the incisions wet in shower but no submerging the wounds until they are completely healed.  He is a note for stand desk to.  He will follow-up with [place] in 1 month check his progress lack of.  Questions encouraged and answered at length

## 2018-08-14 ENCOUNTER — Encounter (INDEPENDENT_AMBULATORY_CARE_PROVIDER_SITE_OTHER): Payer: Self-pay | Admitting: Orthopaedic Surgery

## 2018-09-06 ENCOUNTER — Other Ambulatory Visit: Payer: Self-pay

## 2018-09-06 ENCOUNTER — Encounter (INDEPENDENT_AMBULATORY_CARE_PROVIDER_SITE_OTHER): Payer: Self-pay | Admitting: Orthopaedic Surgery

## 2018-09-06 ENCOUNTER — Ambulatory Visit (INDEPENDENT_AMBULATORY_CARE_PROVIDER_SITE_OTHER): Payer: Commercial Managed Care - PPO | Admitting: Orthopaedic Surgery

## 2018-09-06 DIAGNOSIS — Z96643 Presence of artificial hip joint, bilateral: Secondary | ICD-10-CM

## 2018-09-06 NOTE — Progress Notes (Signed)
The patient is well-known to me.  He is a 38 year old who is now 6 weeks out from bilateral total hip arthroplasties.  He is walking without assistive device.  He says his stiffness is decreasing and his range of motion and strength are improving each day.  He is try to stay active and walk.  On exam he is still stiff but improved much overall since I saw him the last visit.  He is standing more upright and he gets out of chair easily.  He is walking without assistive device.  When I had him lay in a supine position his leg lengths are equal.  At this point I will give him a note to release him to work starting May 11 as he continues to rehabilitate his hips.  All question concerns were answered and addressed.  I do not need to see him back for 6 months unless he is having any issues.  At that visit I like a standing low AP pelvis and lateral of each hip.

## 2018-09-21 ENCOUNTER — Encounter: Payer: Self-pay | Admitting: Orthopaedic Surgery

## 2019-03-08 ENCOUNTER — Other Ambulatory Visit: Payer: Self-pay

## 2019-03-08 ENCOUNTER — Ambulatory Visit: Payer: Commercial Managed Care - PPO

## 2019-03-08 ENCOUNTER — Ambulatory Visit (INDEPENDENT_AMBULATORY_CARE_PROVIDER_SITE_OTHER): Payer: Commercial Managed Care - PPO | Admitting: Orthopaedic Surgery

## 2019-03-08 ENCOUNTER — Encounter: Payer: Self-pay | Admitting: Orthopaedic Surgery

## 2019-03-08 DIAGNOSIS — Z96643 Presence of artificial hip joint, bilateral: Secondary | ICD-10-CM | POA: Diagnosis not present

## 2019-03-08 NOTE — Progress Notes (Signed)
HPI: Troy Bowen returns now 8 months status post bilateral total hip arthroplasty.  Doing pretty good.  States he still has some decreased range of motion both lives with external rotation.  Some soreness at times in his thighs and legs.  He is overall trending towards improvement.  He is happy with the results thus far.  Review of systems: No fevers chills shortness of breath chest pain  Physical exam: General well-developed well-nourished male no acute distress mood and affect appropriate.   Psych: Alert X3 Bilateral hips good internal rotation.  External rotation slightly limited bilaterally.  Calf supple nontender.   Impression: Status post bilateral total hip arthroplasties 07/21/2018.  Plan: We will see him back in a year postop no radiographs today paralyze contraindicated.  Questions were encouraged and answered by Dr. Ninfa Linden and myself.

## 2019-07-09 ENCOUNTER — Ambulatory Visit: Payer: Commercial Managed Care - PPO | Admitting: Orthopaedic Surgery

## 2019-07-12 ENCOUNTER — Ambulatory Visit: Payer: Commercial Managed Care - PPO | Admitting: Orthopaedic Surgery

## 2019-08-01 ENCOUNTER — Ambulatory Visit: Payer: Commercial Managed Care - PPO | Admitting: Orthopaedic Surgery

## 2019-08-01 ENCOUNTER — Encounter: Payer: Self-pay | Admitting: Orthopaedic Surgery

## 2019-08-01 ENCOUNTER — Other Ambulatory Visit: Payer: Self-pay

## 2019-08-01 DIAGNOSIS — Z96643 Presence of artificial hip joint, bilateral: Secondary | ICD-10-CM

## 2019-08-01 NOTE — Progress Notes (Signed)
The patient is now 12 months out from bilateral total hip arthroplasties.  These were done at the same time.  This was due to avascular necrosis.  He is only 39 years old.  He says he has good range of motion.  He does report some thigh pain but has no issues otherwise.  He states he is pleased.  On exam he walks without any type of limp.  His leg lengths are equal.  He has full range of motion of each hip without any difficulties or blocks to motion at all.  We did not x-ray his hips today.  We did x-ray him at last visit that showed well-seated implants with no complicating features.  At this point follow-up can be as needed.  He understands that there is any issues at all as it relates to his hips or anything else orthopedics that we can see him back.  We had a long thorough discussion about this.  All question concerns were answered and addressed.

## 2019-09-28 IMAGING — RF DG HIP (WITH PELVIS) OPERATIVE*L*
1 series · 6 of 6 positions shown · non-contrast
Comparison: None.

CLINICAL DATA: Bilateral hip replacements.

EXAM:
DG C-ARM 61-120 MIN; OPERATIVE RIGHT HIP WITH PELVIS; OPERATIVE LEFT
HIP WITH PELVIS

[Series 1: unknown protocol · 0.20mm/px · 6 of 6 slices shown]
[im 1/6]
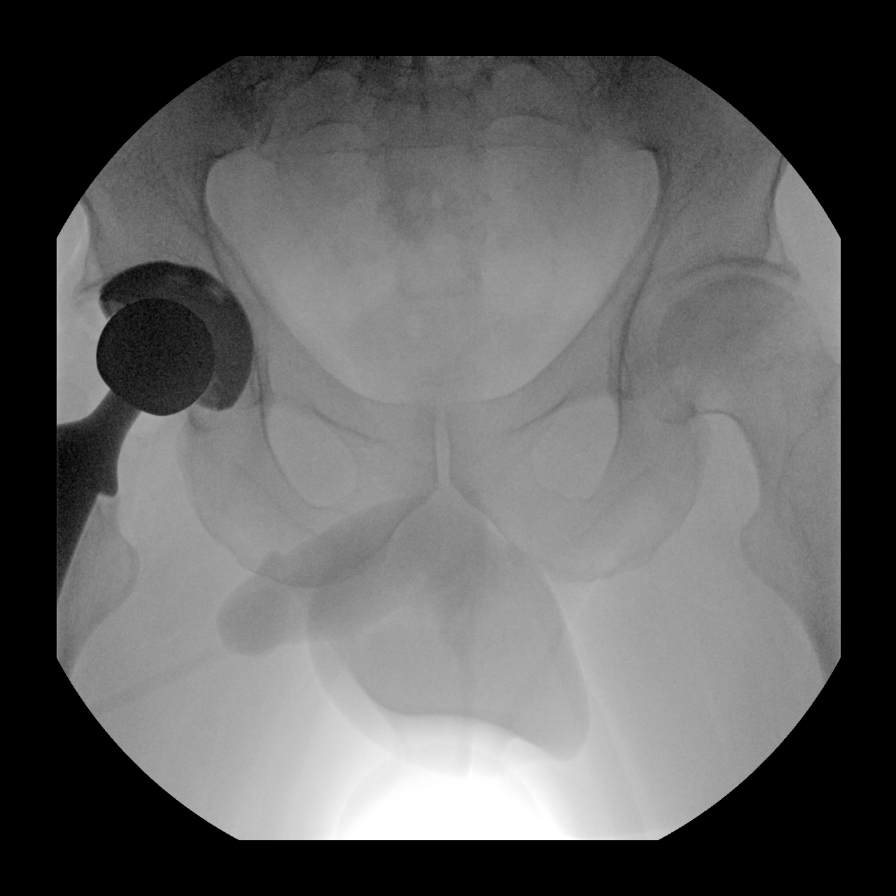
[im 2/6]
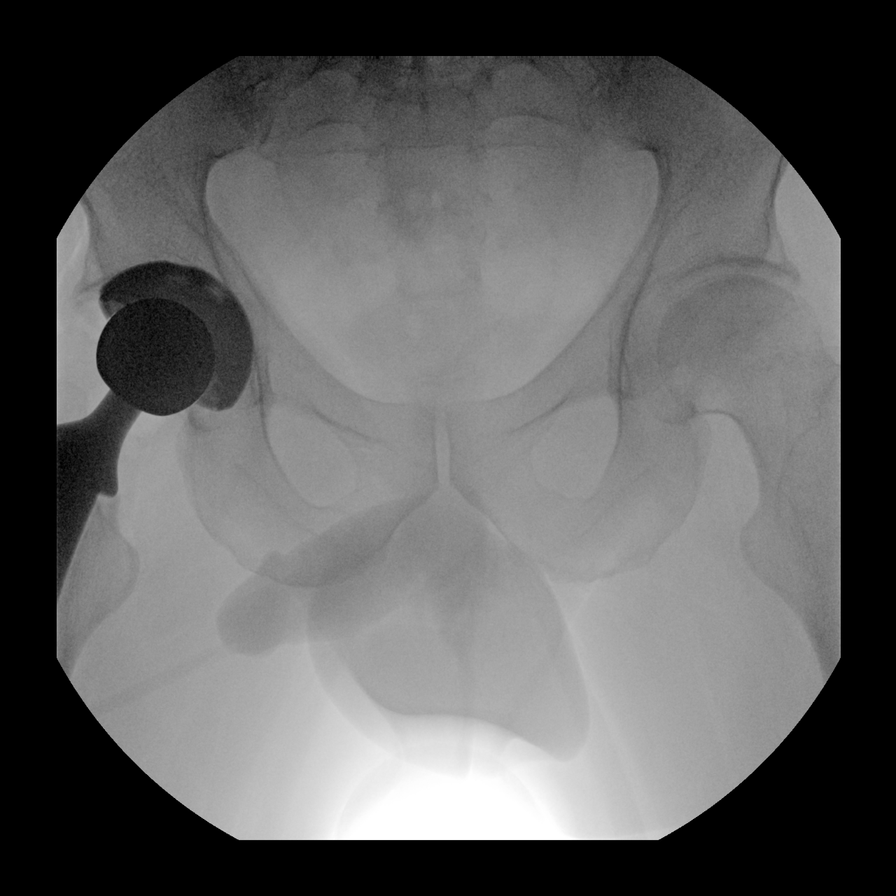
[im 3/6]
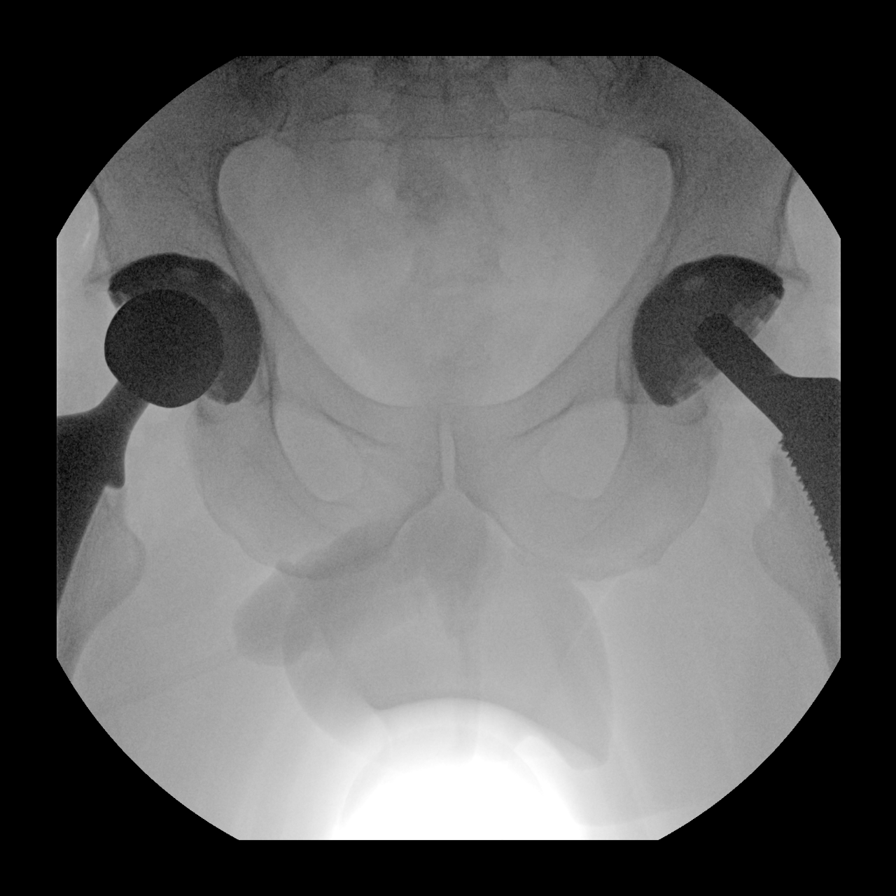
[im 4/6]
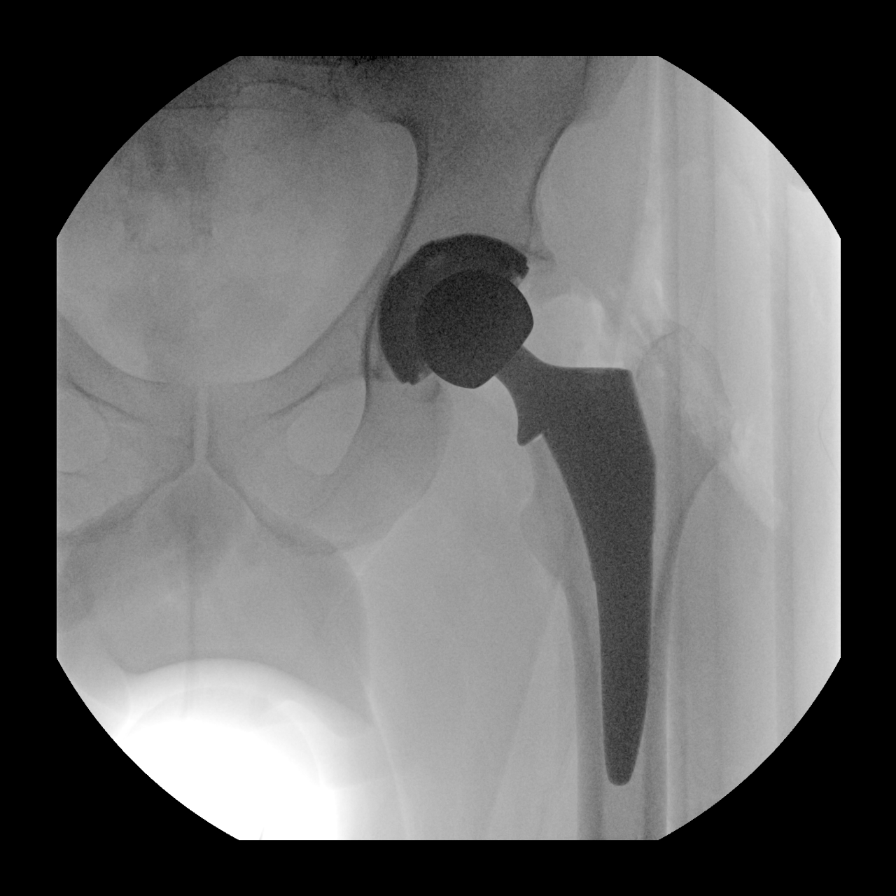
[im 5/6]
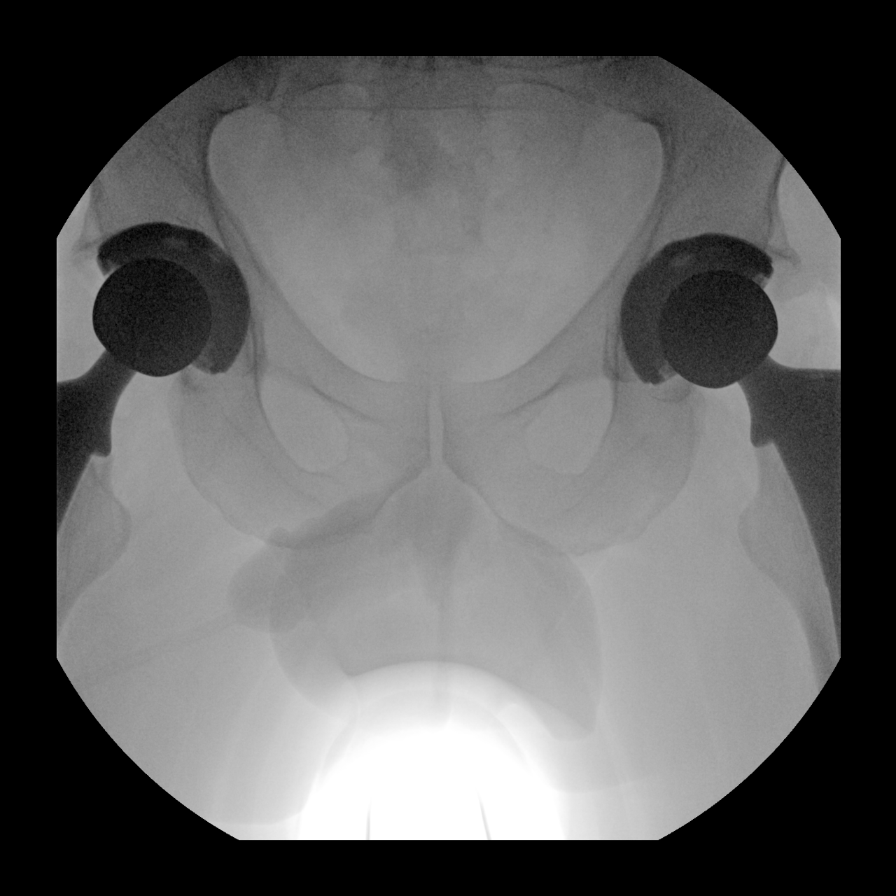
[im 6/6]
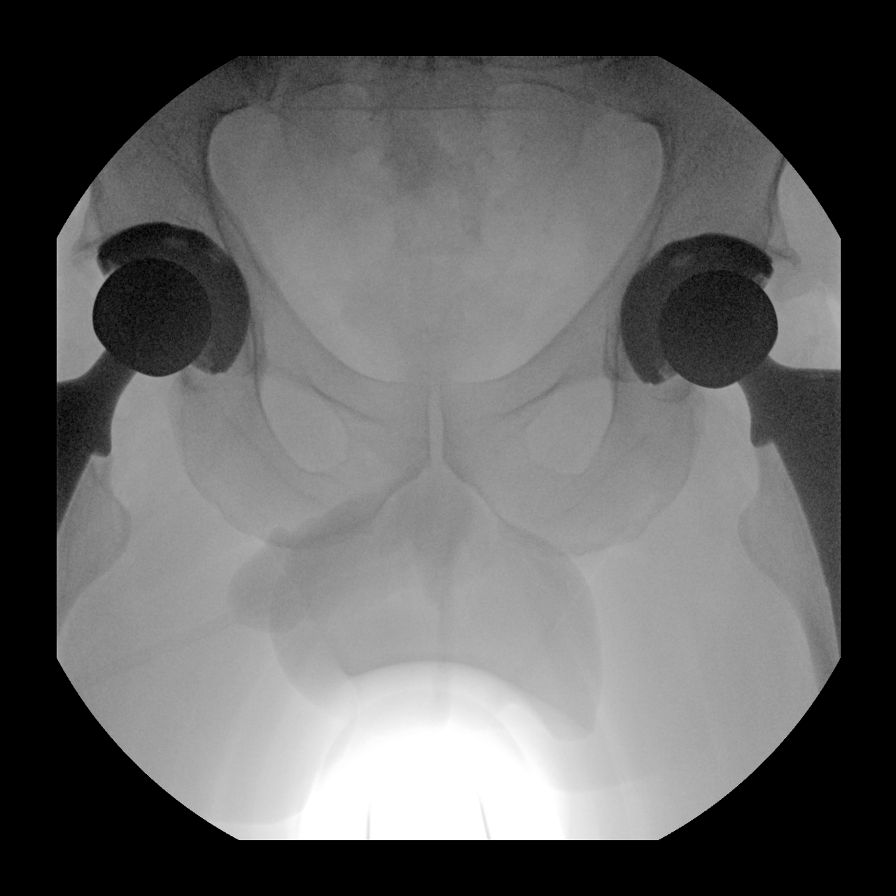

[6 of 6 positions shown; findings below may reference images not displayed]

FLUOROSCOPY TIME:  Fluoroscopy Time:  24 seconds

Radiation Exposure Index (if provided by the fluoroscopic device):
3.75 mGy

Number of Acquired Spot Images: 0
FINDINGS: Intraoperative fluoro spot images demonstrate a right total hip
arthroplasty in place. No fractures are evident.

Subsequent left total hip arthroplasty is documented. No associated
fractures are present.
IMPRESSION: 1. Status post bilateral total hip arthroplasties without
radiographic evidence for complication on AP fluoro spot images.
